# Patient Record
Sex: Female | Born: 1985 | Race: Black or African American | Hispanic: No | Marital: Single | Smoking: Current every day smoker
Health system: Southern US, Community
[De-identification: ages and names within clinical notes are randomized; demographics above are authoritative.]

## PROBLEM LIST (undated history)

## (undated) ENCOUNTER — Emergency Department: Admission: EM | Discharge status: Absent without leave | Payer: Medicaid Other

## (undated) ENCOUNTER — Inpatient Hospital Stay: Payer: Self-pay

## (undated) DIAGNOSIS — E119 Type 2 diabetes mellitus without complications: Secondary | ICD-10-CM

## (undated) DIAGNOSIS — D573 Sickle-cell trait: Secondary | ICD-10-CM

---
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
## 2006-04-16 ENCOUNTER — Emergency Department: Payer: Self-pay | Admitting: Emergency Medicine

## 2006-08-04 ENCOUNTER — Emergency Department: Payer: Self-pay | Admitting: Emergency Medicine

## 2006-09-07 ENCOUNTER — Emergency Department: Payer: Self-pay | Admitting: Emergency Medicine

## 2006-09-09 ENCOUNTER — Emergency Department: Payer: Self-pay | Admitting: Emergency Medicine

## 2008-03-09 ENCOUNTER — Emergency Department: Payer: Self-pay | Admitting: Emergency Medicine

## 2008-06-27 ENCOUNTER — Ambulatory Visit: Payer: Self-pay

## 2008-07-01 ENCOUNTER — Ambulatory Visit: Payer: Self-pay

## 2008-07-19 ENCOUNTER — Encounter: Payer: Self-pay | Admitting: Pediatric Cardiology

## 2008-08-01 ENCOUNTER — Ambulatory Visit: Payer: Self-pay

## 2008-08-16 ENCOUNTER — Inpatient Hospital Stay: Payer: Self-pay | Admitting: Internal Medicine

## 2008-08-25 ENCOUNTER — Encounter: Payer: Self-pay | Admitting: Obstetrics and Gynecology

## 2008-09-10 ENCOUNTER — Inpatient Hospital Stay: Payer: Self-pay | Admitting: Obstetrics and Gynecology

## 2008-09-19 ENCOUNTER — Emergency Department: Payer: Self-pay | Admitting: Emergency Medicine

## 2009-06-11 ENCOUNTER — Emergency Department: Payer: Self-pay | Admitting: Emergency Medicine

## 2009-08-29 ENCOUNTER — Emergency Department: Payer: Self-pay | Admitting: Emergency Medicine

## 2010-04-07 ENCOUNTER — Emergency Department: Payer: Self-pay | Admitting: Emergency Medicine

## 2010-04-08 ENCOUNTER — Emergency Department: Payer: Self-pay | Admitting: Emergency Medicine

## 2010-07-12 ENCOUNTER — Emergency Department: Payer: Self-pay | Admitting: Emergency Medicine

## 2010-07-20 ENCOUNTER — Ambulatory Visit: Payer: Self-pay | Admitting: Obstetrics and Gynecology

## 2010-08-01 ENCOUNTER — Ambulatory Visit: Payer: Self-pay | Admitting: Obstetrics and Gynecology

## 2010-08-22 ENCOUNTER — Observation Stay: Payer: Self-pay | Admitting: Obstetrics and Gynecology

## 2010-09-06 ENCOUNTER — Emergency Department: Payer: Self-pay | Admitting: Emergency Medicine

## 2010-09-23 ENCOUNTER — Inpatient Hospital Stay: Payer: Self-pay | Admitting: Obstetrics and Gynecology

## 2010-10-01 ENCOUNTER — Encounter: Payer: Self-pay | Admitting: Obstetrics and Gynecology

## 2010-10-04 ENCOUNTER — Encounter: Payer: Self-pay | Admitting: Maternal & Fetal Medicine

## 2010-10-08 ENCOUNTER — Encounter: Payer: Self-pay | Admitting: Obstetrics and Gynecology

## 2010-10-11 ENCOUNTER — Encounter: Payer: Self-pay | Admitting: Maternal and Fetal Medicine

## 2010-10-15 ENCOUNTER — Encounter: Payer: Self-pay | Admitting: Obstetrics and Gynecology

## 2010-10-18 ENCOUNTER — Encounter: Payer: Self-pay | Admitting: Maternal and Fetal Medicine

## 2010-10-22 ENCOUNTER — Encounter: Payer: Self-pay | Admitting: Obstetrics and Gynecology

## 2010-10-25 ENCOUNTER — Encounter: Payer: Self-pay | Admitting: Maternal & Fetal Medicine

## 2010-10-29 ENCOUNTER — Encounter: Payer: Self-pay | Admitting: Obstetrics and Gynecology

## 2010-10-30 ENCOUNTER — Encounter: Payer: Self-pay | Admitting: Obstetrics and Gynecology

## 2010-11-01 ENCOUNTER — Encounter: Payer: Self-pay | Admitting: Obstetrics & Gynecology

## 2010-11-05 ENCOUNTER — Encounter: Payer: Self-pay | Admitting: Obstetrics and Gynecology

## 2010-11-08 ENCOUNTER — Encounter: Payer: Self-pay | Admitting: Obstetrics and Gynecology

## 2010-12-05 ENCOUNTER — Observation Stay: Payer: Self-pay | Admitting: Obstetrics and Gynecology

## 2011-01-05 ENCOUNTER — Emergency Department: Payer: Self-pay | Admitting: Emergency Medicine

## 2011-01-06 ENCOUNTER — Emergency Department: Payer: Self-pay | Admitting: Unknown Physician Specialty

## 2011-02-04 ENCOUNTER — Ambulatory Visit: Payer: Self-pay | Admitting: Obstetrics and Gynecology

## 2011-02-13 ENCOUNTER — Emergency Department: Payer: Self-pay | Admitting: Emergency Medicine

## 2011-03-02 ENCOUNTER — Ambulatory Visit: Payer: Self-pay | Admitting: Obstetrics and Gynecology

## 2012-09-24 ENCOUNTER — Emergency Department: Payer: Self-pay | Admitting: Emergency Medicine

## 2013-02-15 ENCOUNTER — Emergency Department: Payer: Self-pay | Admitting: Emergency Medicine

## 2013-02-15 LAB — COMPREHENSIVE METABOLIC PANEL
Albumin: 3.5 g/dL (ref 3.4–5.0)
Alkaline Phosphatase: 91 U/L (ref 50–136)
Anion Gap: 5 — ABNORMAL LOW (ref 7–16)
BUN: 10 mg/dL (ref 7–18)
Chloride: 103 mmol/L (ref 98–107)
Creatinine: 0.75 mg/dL (ref 0.60–1.30)
EGFR (Non-African Amer.): >60
Osmolality: 278 (ref 275–301)
Potassium: 4 mmol/L (ref 3.5–5.1)
SGOT(AST): 17 U/L (ref 15–37)
SGPT (ALT): 19 U/L (ref 12–78)
Total Protein: 7.4 g/dL (ref 6.4–8.2)

## 2013-02-15 LAB — URINALYSIS, COMPLETE
Bacteria: NONE SEEN
Bilirubin,UR: NEGATIVE
Glucose,UR: >=500 mg/dL (ref 0–75)
Ketone: NEGATIVE
Nitrite: NEGATIVE
RBC,UR: 2 /HPF (ref 0–5)
Squamous Epithelial: 11
WBC UR: 17 /HPF (ref 0–5)

## 2013-02-15 LAB — CBC
HCT: 41.1 % (ref 35.0–47.0)
MCHC: 35 g/dL (ref 32.0–36.0)
Platelet: 231 10*3/uL (ref 150–440)
RBC: 4.81 10*6/uL (ref 3.80–5.20)
RDW: 13.2 % (ref 11.5–14.5)
WBC: 11 10*3/uL (ref 3.6–11.0)

## 2013-02-16 ENCOUNTER — Emergency Department: Payer: Self-pay | Admitting: Emergency Medicine

## 2013-02-16 LAB — URINALYSIS, COMPLETE
Bilirubin,UR: NEGATIVE
Glucose,UR: >=500 mg/dL (ref 0–75)
Nitrite: NEGATIVE
Ph: 6 (ref 4.5–8.0)
Specific Gravity: 1.016 (ref 1.003–1.030)
Squamous Epithelial: 3
WBC UR: 3 /HPF (ref 0–5)

## 2013-02-16 LAB — CBC WITH DIFFERENTIAL/PLATELET
Basophil #: 0.1 10*3/uL (ref 0.0–0.1)
Basophil %: 0.6 %
Eosinophil #: 0.3 10*3/uL (ref 0.0–0.7)
Eosinophil %: 2.1 %
HCT: 42.8 % (ref 35.0–47.0)
HGB: 14.9 g/dL (ref 12.0–16.0)
Lymphocyte #: 3.3 10*3/uL (ref 1.0–3.6)
Lymphocyte %: 25.6 %
MCH: 29.8 pg (ref 26.0–34.0)
MCHC: 34.8 g/dL (ref 32.0–36.0)
MCV: 86 fL (ref 80–100)
Monocyte %: 6.9 %
Neutrophil #: 8.3 10*3/uL — ABNORMAL HIGH (ref 1.4–6.5)
Neutrophil %: 64.8 %
Platelet: 242 10*3/uL (ref 150–440)
RBC: 5 10*6/uL (ref 3.80–5.20)
RDW: 13.3 % (ref 11.5–14.5)
WBC: 12.8 10*3/uL — ABNORMAL HIGH (ref 3.6–11.0)

## 2013-02-16 LAB — BASIC METABOLIC PANEL
Anion Gap: 7 (ref 7–16)
Calcium, Total: 9.2 mg/dL (ref 8.5–10.1)
Chloride: 103 mmol/L (ref 98–107)
Creatinine: 0.74 mg/dL (ref 0.60–1.30)
EGFR (Non-African Amer.): >60
Potassium: 4 mmol/L (ref 3.5–5.1)
Sodium: 133 mmol/L — ABNORMAL LOW (ref 136–145)

## 2013-02-17 LAB — URINE CULTURE

## 2013-11-03 ENCOUNTER — Inpatient Hospital Stay: Payer: Self-pay | Admitting: Specialist

## 2013-11-03 LAB — URINALYSIS, COMPLETE
BILIRUBIN, UR: NEGATIVE
Bacteria: NONE SEEN
Hyaline Cast: 4
NITRITE: NEGATIVE
PH: 5 (ref 4.5–8.0)
Protein: 100
RBC,UR: 11 /HPF (ref 0–5)
SPECIFIC GRAVITY: 1.009 (ref 1.003–1.030)
Squamous Epithelial: 4
WBC UR: 56 /HPF (ref 0–5)

## 2013-11-03 LAB — CBC
HCT: 42.1 % (ref 35.0–47.0)
HGB: 13.6 g/dL (ref 12.0–16.0)
MCH: 28.5 pg (ref 26.0–34.0)
MCHC: 32.4 g/dL (ref 32.0–36.0)
MCV: 88 fL (ref 80–100)
Platelet: 188 10*3/uL (ref 150–440)
RBC: 4.79 10*6/uL (ref 3.80–5.20)
RDW: 13.3 % (ref 11.5–14.5)
WBC: 26.6 10*3/uL — ABNORMAL HIGH (ref 3.6–11.0)

## 2013-11-03 LAB — COMPREHENSIVE METABOLIC PANEL
ALBUMIN: 3.1 g/dL — AB (ref 3.4–5.0)
ALK PHOS: 116 U/L
ALT: 14 U/L (ref 12–78)
AST: 19 U/L (ref 15–37)
Anion Gap: 9 (ref 7–16)
BUN: 8 mg/dL (ref 7–18)
Bilirubin,Total: 0.8 mg/dL (ref 0.2–1.0)
CHLORIDE: 102 mmol/L (ref 98–107)
CREATININE: 1.44 mg/dL — AB (ref 0.60–1.30)
Calcium, Total: 9 mg/dL (ref 8.5–10.1)
Co2: 19 mmol/L — ABNORMAL LOW (ref 21–32)
EGFR (African American): 58 — ABNORMAL LOW
GFR CALC NON AF AMER: 50 — AB
Glucose: 261 mg/dL — ABNORMAL HIGH (ref 65–99)
OSMOLALITY: 268 (ref 275–301)
Potassium: 3.6 mmol/L (ref 3.5–5.1)
Sodium: 130 mmol/L — ABNORMAL LOW (ref 136–145)
Total Protein: 7.7 g/dL (ref 6.4–8.2)

## 2013-11-03 LAB — PREGNANCY, URINE: Pregnancy Test, Urine: NEGATIVE m[IU]/mL

## 2013-11-03 LAB — LIPASE, BLOOD: LIPASE: 62 U/L — AB (ref 73–393)

## 2013-11-04 LAB — URINE CULTURE

## 2013-11-04 LAB — CBC WITH DIFFERENTIAL/PLATELET
BASOS ABS: 0 10*3/uL (ref 0.0–0.1)
BASOS PCT: 0.1 %
EOS ABS: 0 10*3/uL (ref 0.0–0.7)
Eosinophil %: 0 %
HCT: 33.6 % — ABNORMAL LOW (ref 35.0–47.0)
HGB: 11.1 g/dL — AB (ref 12.0–16.0)
LYMPHS ABS: 0.2 10*3/uL — AB (ref 1.0–3.6)
Lymphocyte %: 1 %
MCH: 28.7 pg (ref 26.0–34.0)
MCHC: 33 g/dL (ref 32.0–36.0)
MCV: 87 fL (ref 80–100)
Monocyte #: 0.8 x10 3/mm (ref 0.2–0.9)
Monocyte %: 4.5 %
NEUTROS ABS: 17.1 10*3/uL — AB (ref 1.4–6.5)
Neutrophil %: 94.4 %
PLATELETS: 142 10*3/uL — AB (ref 150–440)
RBC: 3.86 10*6/uL (ref 3.80–5.20)
RDW: 13 % (ref 11.5–14.5)
WBC: 18.1 10*3/uL — ABNORMAL HIGH (ref 3.6–11.0)

## 2013-11-04 LAB — BASIC METABOLIC PANEL
Anion Gap: 10 (ref 7–16)
BUN: 8 mg/dL (ref 7–18)
Calcium, Total: 7.1 mg/dL — ABNORMAL LOW (ref 8.5–10.1)
Chloride: 110 mmol/L — ABNORMAL HIGH (ref 98–107)
Co2: 15 mmol/L — ABNORMAL LOW (ref 21–32)
Creatinine: 1.31 mg/dL — ABNORMAL HIGH (ref 0.60–1.30)
EGFR (African American): >60
GFR CALC NON AF AMER: 56 — AB
Glucose: 223 mg/dL — ABNORMAL HIGH (ref 65–99)
Osmolality: 275 (ref 275–301)
Potassium: 3.7 mmol/L (ref 3.5–5.1)
Sodium: 135 mmol/L — ABNORMAL LOW (ref 136–145)

## 2013-11-04 LAB — HEMOGLOBIN A1C: HEMOGLOBIN A1C: 11.1 % — AB (ref 4.2–6.3)

## 2013-11-04 LAB — TSH: THYROID STIMULATING HORM: 0.638 u[IU]/mL

## 2013-11-07 LAB — BASIC METABOLIC PANEL
ANION GAP: 8 (ref 7–16)
BUN: 4 mg/dL — ABNORMAL LOW (ref 7–18)
Calcium, Total: 7.7 mg/dL — ABNORMAL LOW (ref 8.5–10.1)
Chloride: 112 mmol/L — ABNORMAL HIGH (ref 98–107)
Co2: 19 mmol/L — ABNORMAL LOW (ref 21–32)
Creatinine: 0.94 mg/dL (ref 0.60–1.30)
EGFR (African American): >60
Glucose: 107 mg/dL — ABNORMAL HIGH (ref 65–99)
OSMOLALITY: 275 (ref 275–301)
POTASSIUM: 3 mmol/L — AB (ref 3.5–5.1)
SODIUM: 139 mmol/L (ref 136–145)

## 2013-11-07 LAB — CBC WITH DIFFERENTIAL/PLATELET
Basophil #: 0.1 10*3/uL (ref 0.0–0.1)
Basophil %: 0.5 %
EOS ABS: 0.1 10*3/uL (ref 0.0–0.7)
Eosinophil %: 0.9 %
HCT: 29.8 % — ABNORMAL LOW (ref 35.0–47.0)
HGB: 10.2 g/dL — AB (ref 12.0–16.0)
Lymphocyte #: 1.8 10*3/uL (ref 1.0–3.6)
Lymphocyte %: 14.5 %
MCH: 29.3 pg (ref 26.0–34.0)
MCHC: 34.2 g/dL (ref 32.0–36.0)
MCV: 86 fL (ref 80–100)
Monocyte #: 1.8 x10 3/mm — ABNORMAL HIGH (ref 0.2–0.9)
Monocyte %: 14 %
Neutrophil #: 8.9 10*3/uL — ABNORMAL HIGH (ref 1.4–6.5)
Neutrophil %: 70.1 %
Platelet: 165 10*3/uL (ref 150–440)
RBC: 3.48 10*6/uL — ABNORMAL LOW (ref 3.80–5.20)
RDW: 13.8 % (ref 11.5–14.5)
WBC: 12.8 10*3/uL — AB (ref 3.6–11.0)

## 2013-11-08 LAB — BASIC METABOLIC PANEL
ANION GAP: 10 (ref 7–16)
BUN: 4 mg/dL — ABNORMAL LOW (ref 7–18)
CO2: 22 mmol/L (ref 21–32)
Calcium, Total: 8 mg/dL — ABNORMAL LOW (ref 8.5–10.1)
Chloride: 111 mmol/L — ABNORMAL HIGH (ref 98–107)
Creatinine: 0.98 mg/dL (ref 0.60–1.30)
EGFR (African American): >60
EGFR (Non-African Amer.): >60
GLUCOSE: 92 mg/dL (ref 65–99)
OSMOLALITY: 282 (ref 275–301)
POTASSIUM: 3.5 mmol/L (ref 3.5–5.1)
SODIUM: 143 mmol/L (ref 136–145)

## 2013-11-08 LAB — CBC WITH DIFFERENTIAL/PLATELET
BANDS NEUTROPHIL: 3 %
COMMENT - H1-COM1: NORMAL
HCT: 31.7 % — ABNORMAL LOW (ref 35.0–47.0)
HGB: 10.8 g/dL — ABNORMAL LOW (ref 12.0–16.0)
Lymphocytes: 22 %
MCH: 29.3 pg (ref 26.0–34.0)
MCHC: 34.1 g/dL (ref 32.0–36.0)
MCV: 86 fL (ref 80–100)
MONOS PCT: 17 %
Metamyelocyte: 2 %
Myelocyte: 3 %
Platelet: 181 10*3/uL (ref 150–440)
RBC: 3.69 10*6/uL — ABNORMAL LOW (ref 3.80–5.20)
RDW: 14.2 % (ref 11.5–14.5)
Segmented Neutrophils: 52 %
Variant Lymphocyte - H1-Rlymph: 1 %
WBC: 12 10*3/uL — AB (ref 3.6–11.0)

## 2013-11-08 LAB — URINE CULTURE

## 2013-11-10 LAB — CULTURE, BLOOD (SINGLE)

## 2014-01-24 ENCOUNTER — Emergency Department: Payer: Self-pay | Admitting: Emergency Medicine

## 2014-01-24 LAB — URINALYSIS, COMPLETE
Bilirubin,UR: NEGATIVE
GLUCOSE, UR: NEGATIVE mg/dL (ref 0–75)
NITRITE: NEGATIVE
PH: 5 (ref 4.5–8.0)
SPECIFIC GRAVITY: 1.012 (ref 1.003–1.030)
Squamous Epithelial: 20

## 2014-01-24 LAB — COMPREHENSIVE METABOLIC PANEL
AST: 16 U/L (ref 15–37)
Albumin: 3.1 g/dL — ABNORMAL LOW (ref 3.4–5.0)
Alkaline Phosphatase: 82 U/L
Anion Gap: 9 (ref 7–16)
BUN: 5 mg/dL — AB (ref 7–18)
Bilirubin,Total: 0.8 mg/dL (ref 0.2–1.0)
CALCIUM: 8.4 mg/dL — AB (ref 8.5–10.1)
CO2: 20 mmol/L — AB (ref 21–32)
Chloride: 104 mmol/L (ref 98–107)
Creatinine: 0.91 mg/dL (ref 0.60–1.30)
EGFR (African American): >60
EGFR (Non-African Amer.): >60
Glucose: 144 mg/dL — ABNORMAL HIGH (ref 65–99)
OSMOLALITY: 266 (ref 275–301)
POTASSIUM: 3 mmol/L — AB (ref 3.5–5.1)
SGPT (ALT): 15 U/L
Sodium: 133 mmol/L — ABNORMAL LOW (ref 136–145)
Total Protein: 8.3 g/dL — ABNORMAL HIGH (ref 6.4–8.2)

## 2014-01-24 LAB — CBC
HCT: 39.6 % (ref 35.0–47.0)
HGB: 13 g/dL (ref 12.0–16.0)
MCH: 28.7 pg (ref 26.0–34.0)
MCHC: 32.9 g/dL (ref 32.0–36.0)
MCV: 87 fL (ref 80–100)
Platelet: 267 10*3/uL (ref 150–440)
RBC: 4.53 10*6/uL (ref 3.80–5.20)
RDW: 14.4 % (ref 11.5–14.5)
WBC: 22.5 10*3/uL — AB (ref 3.6–11.0)

## 2014-01-24 LAB — LIPASE, BLOOD: Lipase: 95 U/L (ref 73–393)

## 2014-01-26 LAB — URINE CULTURE

## 2014-09-01 DIAGNOSIS — Z8751 Personal history of pre-term labor: Secondary | ICD-10-CM | POA: Insufficient documentation

## 2014-09-02 DIAGNOSIS — A5901 Trichomonal vulvovaginitis: Secondary | ICD-10-CM | POA: Insufficient documentation

## 2014-09-02 LAB — OB RESULTS CONSOLE RPR: RPR: NONREACTIVE

## 2014-09-02 LAB — OB RESULTS CONSOLE HGB/HCT, BLOOD
HEMATOCRIT: 34 %
HEMOGLOBIN: 13.5 g/dL

## 2014-09-02 LAB — OB RESULTS CONSOLE PLATELET COUNT: PLATELETS: 239 10*3/uL

## 2014-09-02 LAB — OB RESULTS CONSOLE VARICELLA ZOSTER ANTIBODY, IGG: Varicella: IMMUNE

## 2014-09-02 LAB — OB RESULTS CONSOLE RUBELLA ANTIBODY, IGM: Rubella: IMMUNE

## 2014-09-02 LAB — OB RESULTS CONSOLE HIV ANTIBODY (ROUTINE TESTING): HIV: NONREACTIVE

## 2014-09-02 LAB — OB RESULTS CONSOLE HEPATITIS B SURFACE ANTIGEN: HEP B S AG: NEGATIVE

## 2014-09-19 ENCOUNTER — Ambulatory Visit
Admit: 2014-09-19 | Disposition: A | Discharge status: Home / Self Care | Payer: Self-pay | Attending: Obstetrics and Gynecology | Admitting: Obstetrics and Gynecology

## 2014-09-30 ENCOUNTER — Ambulatory Visit
Admit: 2014-09-30 | Disposition: A | Discharge status: Home / Self Care | Payer: Self-pay | Attending: Obstetrics and Gynecology | Admitting: Obstetrics and Gynecology

## 2014-10-11 ENCOUNTER — Encounter
Admit: 2014-10-11 | Disposition: A | Discharge status: Home / Self Care | Payer: Self-pay | Attending: Pediatric Cardiology | Admitting: Pediatric Cardiology

## 2014-10-22 NOTE — H&P (Signed)
PATIENT NAME:  Victoria Cortez, Victoria Cortez DATE OF BIRTH:  21-Nov-1985  DATE OF ADMISSION:  11/03/2013  PRIMARY CARE PROVIDER: None.  EMERGENCY DEPARTMENT REFERRING PHYSICIAN: Dr. Mayford KnifeWilliams.   CHIEF COMPLAINT: Left lower quadrant abdominal pain, fever.   HISTORY OF PRESENT ILLNESS: The patient is a 29 year old African American female with history of diabetes, which started as gestational diabetes and has had diabetes since 3 years, is not on any treatment due to unable to get any medications and be seen by a physician according to her, who presents with a sharp left lower quadrant abdominal pain ongoing since yesterday. She also has had nausea and had high fevers. Has not had any emesis. The patient also has been having some shortness of breath and coughing. She came to the ED and was noted to have an elevated WBC count at 26,000, and her urinalysis was positive for a urinary tract infection, with CT of the abdomen suggestive of acute pyelonephritis. The patient does complain of pressure with urination, but no burning or hesitancy. She denies any chest pain or palpitations. Denies any diarrhea.   PAST MEDICAL HISTORY: 1.  History for diabetes.  2.  Sickle cell trait.   PAST SURGICAL HISTORY: Status post C-section.   ALLERGIES: None.   MEDICATIONS: None.   SOCIAL HISTORY: Smokes 1 pack per day for multiple years. Drinks socially. No drug use.   FAMILY HISTORY: Father with diabetes.  REVIEW OF SYSTEMS:    CONSTITUTIONAL: Complains of fever, fatigue, weakness, left lower quadrant sharp abdominal pain. No weight loss. No weight gains.  EYES: No blurred or double vision. No pain. No redness. No inflammation.  EARS, NOSE, THROAT: No tinnitus. No ear pain. No hearing loss. No seasonal or year-round allergies. No epistaxis. No difficulty swallowing.  RESPIRATORY: Complains of cough but no wheezing, no hemoptysis. Some shortness of breath.  CARDIOVASCULAR: Denies any chest pain, orthopnea,  edema or arrhythmia.  GASTROINTESTINAL: Complains of some nausea but no vomiting, diarrhea. Complains of left lower quadrant sharp abdominal pain. No hematemesis. No melena. No IBS. No jaundice.  GENITOURINARY: Denies any dysuria, hematuria. Complains of pressure with urination.  ENDOCRINE: Complains of polyhidria, nocturia.  HEMATOLOGIC AND LYMPHATIC: Denies anemia, bruisability or bleeding.  SKIN: No acne or rash. No changes in mole, hair or skin.  MUSCULOSKELETAL: Denies any pain in neck, back or shoulder.  NEUROLOGIC: No numbness, CVA, TIA or seizures.  PSYCHIATRIC: No anxiety, insomnia or ADD.   PHYSICAL EXAMINATION: VITAL SIGNS: Temperature 98.5, pulse 126, respirations 18, blood pressure 128/68, O2 sat 99%.  GENERAL: The patient is a well-developed, well-nourished female in no acute distress.  HEENT: Head atraumatic, normocephalic. Pupils equally round, reactive to light and accommodation. There is no conjunctival pallor. No scleral icterus. Nasal exam shows no drainage or ulceration. Oropharynx is very dry. No exudate.  NECK: Supple without any JVD.  CARDIOVASCULAR: Tachycardic. No murmurs, rubs, clicks or gallops.  LUNGS: Clear to auscultation bilaterally without any rales, rhonchi, wheezing.  ABDOMEN: Soft, nontender, nondistended. Positive bowel sounds x 4.  EXTREMITIES: No clubbing, cyanosis or edema.  SKIN: No rash.  LYMPHATICS: No lymph nodes palpable.  VASCULAR: Good DP, PT pulses.  PSYCHIATRIC: Not anxious or depressed.  NEUROLOGIC: Awake, alert, oriented x 3. No focal deficits.   LABORATORY AND RADIOLOGICAL DATA: Glucose 261, BUN 8, creatinine 1.44, sodium 130, potassium 3.6, chloride 102. CO2 is 19, calcium 9.0, lipase 62. LFTs: Total protein 7.7, albumin 3.1, bilirubin total 0.8; alkaline phosphatase is 116, AST  19, ALT 14. WBC 26.6, hemoglobin 13.6, platelet count 188. Urinalysis: 2+ leukocytes, WBCs 56.   CT scan of the abdomen and pelvis shows mildly enlarged and  acutely inflamed left kidney and ureter. No obstruction. Distended bladder with perhaps mild bladder wall thickening. Normal right kidney and ureter. Mild bronchiectasis and increased marking in the left lower lobe medial  basal segment.   ASSESSMENT AND PLAN: The patient is a 29 year old African American female who presents with left lower quadrant abdominal pain, cough, fevers,  1.  Left lower quadrant abdominal pain due to acute pyelonephritis: Will treat her with IV ceftriaxone. Follow urine cultures.  2.  Possible bronchitis: She will be on ceftriaxone for the urinary tract infection, and I will add azithromycin for atypical coverage.  3.  Diabetes: The patient is not on any treatment. I will check a hemoglobin A1c. Place her on sliding scale. Start her on glipizide. If her renal function normalizes, she can be on metformin. The patient may end up needing insulin.  4.  Acute renal failure: Will give her IV fluids. Follow renal function.  5.  Nicotine addiction: The patient was given smoking cessation; 4 minutes spent. The patient was recommended to stop smoking.  6.  She will be on Lovenox for deep vein thrombosis prophylaxis.   TIME SPENT: 50 minutes.    ____________________________ Lacie Scotts Allena Katz, MD shp:jcm D: 11/03/2013 13:53:35 ET T: 11/03/2013 14:23:18 ET JOB#: [id number]  cc: Analisia Kingsford H. Allena Katz, MD, <Dictator> Charise Carwin MD ELECTRONICALLY SIGNED 11/03/2013 16:18

## 2014-10-22 NOTE — Discharge Summary (Signed)
PATIENT NAME:  Victoria Cortez, Victoria Cortez MR#:  [id number] DATE OF BIRTH:  24-Jul-1985  For a detailed note, please take a look at the history and physical done on admission by Dr. Auburn Bilberry.   DIAGNOSES AT DISCHARGE: As follows:  1. Acute pyelonephritis.  2. Pneumonia.  3. Systemic inflammatory response syndrome secondary to acute pyelonephritis and pneumonia.  4. Leukocytosis secondary to the infection as mentioned as above.  5. Diabetes, uncontrolled.  6. Hypokalemia.   DIET: The patient is being discharged on American Diabetic Association diet.   ACTIVITY: As tolerated.   FOLLOWUP:  Follow up at the [facility] in next 1-2 weeks.   DISCHARGE MEDICATIONS: 1. Metformin 500 mg PO BID.   2. Glipizide 10 mg daily. 3. Augmentin5 mg b.i.d. x 8 days. 4. Ciprofloxacin 5 mg b.i.d. x 8 days.    PERTINENT STUDIES DONE DURING THE HOSPITAL COURSE: Are as follows: A CT scan of the abdomen and pelvis done with contrast showing mildly enlarged and acutely inflamed left kidney and ureter.  No obstructing calculus. Consider pyelonephritis or ascending urinary tract infection, distended bladder. Diverticulosis, mild bronchiectasis, and increased markings on left lower lobe. A chest x-ray done on 11/05/2013 showing a left lower lobe infiltrate or atelectasis. Pneumonia is suspected.   HOSPITAL COURSE: This is a 29 year old female with medical problems as mentioned above, presented to the hospital on 11/03/2013 due to abdominal pain which was left flank pain along with abnormal urinalysis.  1. Acute pyelonephritis.  This was likely the cause of patient's left flank pain, nausea, and vomiting. The patient was admitted to the hospital, started on IV ceftriaxone initially. Eventually switched over to Levaquin and Zosyn. After getting aggressive IV antibiotic therapy and supportive care with IV fluids and pain control, her clinical symptoms have improved. She has left flank pain. She has no further nausea or  vomiting. She does continue to have fevers, but that is expected with acute pyelonephritis.  Her urine cultures although have been negative so have been her blood cultures. At present, I am discharging her on oral Augmentin and ciprofloxacin as stated.  2. Pneumonia. This was noticed on the chest x-ray on May 8th. She has been adequately treated with IV Levaquin in the hospital. She is not hypoxic. She is not having any chest pain. Her sputum cultures and blood cultures have been negative. At present, I am discharging her on oral Augmentin as mentioned. 3. Diabetes. The patient was noncompliant as she was not taking any medications prior to coming to hospital.  Her hemoglobin A1c was noted to be as high as 11. While in the hospital, the patient was treated with glipizide along with sliding scale insulin. At present, I am discharging her on oral metformin and glipizide. She was seen by diabetic lifestyle services in the hospital. She was given a glucometer, test strips, and lancets, and she will follow up with her primary care physician at the [facility] for further management of her diabetes.  4. Acute renal failure. This was likely acute tubular necrosis secondary to acute pyelonephritis and dehydration. It has since then improved and resolved with IV fluids.  5. Tobacco abuse. The patient was strongly advised to quit smoking. Was placed on a nicotine patch while in the hospital.   CODE STATUS: The patient is a full code.   DISPOSITION: She is being discharged home.  TIME SPENT ON DISCHARGE: 40 minutes.   ____________________________ Rolly Pancake. Cherlynn Kaiser, MD vjs:dd D: 11/08/2013 15:44:46 ET  T: 11/09/2013 02:12:49 ET JOB#: [id number]  cc: Rolly PancakeVivek J. Cherlynn KaiserSainani, MD, <Dictator> [facility]  Houston SirenVIVEK J Geral Coker MD ELECTRONICALLY SIGNED 11/10/2013 14:05

## 2014-11-14 ENCOUNTER — Ambulatory Visit: Payer: Self-pay | Admitting: Dietician

## 2014-11-21 ENCOUNTER — Encounter: Payer: Self-pay | Admitting: Obstetrics and Gynecology

## 2014-11-21 ENCOUNTER — Encounter: Payer: Self-pay | Admitting: Dietician

## 2014-11-21 ENCOUNTER — Ambulatory Visit: Payer: Self-pay | Admitting: Dietician

## 2014-11-21 ENCOUNTER — Telehealth: Payer: Self-pay | Admitting: Dietician

## 2014-11-21 NOTE — Telephone Encounter (Signed)
Pt did not come to appt today. Called pt-pt. Reports doing well and BG's good with recent FBG's 88-97 and 2 hr pp BG's 105-150. Currently taking N 38 units and R 22 units every AM + N 18 units + R 18 units before supper. To FU with Dr. Feliberto GottronSchermerhorn on 11-23-14. Pt is 27.[redacted] wks gestation. To be scheduled for c-section at 37 wks. Pt does not want to reschedule appt at this time. Advised pt referral is in effect x1 yr.  and to call  if wishes to schedule further FU.

## 2014-11-24 LAB — OB RESULTS CONSOLE GC/CHLAMYDIA
Chlamydia: NEGATIVE
GC PROBE AMP, GENITAL: NEGATIVE

## 2014-11-24 LAB — OB RESULTS CONSOLE ABO/RH

## 2014-11-24 LAB — OB RESULTS CONSOLE HIV ANTIBODY (ROUTINE TESTING): HIV: NONREACTIVE

## 2014-11-24 LAB — OB RESULTS CONSOLE RPR: RPR: NONREACTIVE

## 2014-11-26 ENCOUNTER — Emergency Department
Admission: EM | Admit: 2014-11-26 | Discharge: 2014-11-26 | Disposition: A | Discharge status: Home / Self Care | Payer: Medicaid Other | Attending: Student | Admitting: Student

## 2014-11-26 ENCOUNTER — Encounter: Payer: Self-pay | Admitting: Emergency Medicine

## 2014-11-26 ENCOUNTER — Observation Stay
Admission: RE | Admit: 2014-11-26 | Discharge: 2014-11-26 | Disposition: A | Discharge status: Home / Self Care | Payer: Medicaid Other | Source: Home / Self Care | Attending: Obstetrics and Gynecology | Admitting: Obstetrics and Gynecology

## 2014-11-26 DIAGNOSIS — Z3A28 28 weeks gestation of pregnancy: Secondary | ICD-10-CM | POA: Insufficient documentation

## 2014-11-26 DIAGNOSIS — O99332 Smoking (tobacco) complicating pregnancy, second trimester: Secondary | ICD-10-CM | POA: Diagnosis not present

## 2014-11-26 DIAGNOSIS — F17210 Nicotine dependence, cigarettes, uncomplicated: Secondary | ICD-10-CM | POA: Insufficient documentation

## 2014-11-26 DIAGNOSIS — Y998 Other external cause status: Secondary | ICD-10-CM | POA: Insufficient documentation

## 2014-11-26 DIAGNOSIS — S199XXA Unspecified injury of neck, initial encounter: Secondary | ICD-10-CM | POA: Insufficient documentation

## 2014-11-26 DIAGNOSIS — E119 Type 2 diabetes mellitus without complications: Secondary | ICD-10-CM | POA: Diagnosis not present

## 2014-11-26 DIAGNOSIS — S3991XA Unspecified injury of abdomen, initial encounter: Secondary | ICD-10-CM | POA: Diagnosis not present

## 2014-11-26 DIAGNOSIS — Y9289 Other specified places as the place of occurrence of the external cause: Secondary | ICD-10-CM | POA: Diagnosis not present

## 2014-11-26 DIAGNOSIS — Z3490 Encounter for supervision of normal pregnancy, unspecified, unspecified trimester: Secondary | ICD-10-CM

## 2014-11-26 DIAGNOSIS — R000 Tachycardia, unspecified: Secondary | ICD-10-CM | POA: Diagnosis not present

## 2014-11-26 DIAGNOSIS — O99282 Endocrine, nutritional and metabolic diseases complicating pregnancy, second trimester: Secondary | ICD-10-CM | POA: Diagnosis not present

## 2014-11-26 DIAGNOSIS — O9989 Other specified diseases and conditions complicating pregnancy, childbirth and the puerperium: Secondary | ICD-10-CM | POA: Diagnosis not present

## 2014-11-26 DIAGNOSIS — Y9389 Activity, other specified: Secondary | ICD-10-CM | POA: Diagnosis not present

## 2014-11-26 DIAGNOSIS — O9A212 Injury, poisoning and certain other consequences of external causes complicating pregnancy, second trimester: Secondary | ICD-10-CM | POA: Insufficient documentation

## 2014-11-26 HISTORY — DX: Sickle-cell trait: D57.3

## 2014-11-26 HISTORY — DX: Type 2 diabetes mellitus without complications: E11.9

## 2014-11-26 LAB — URINALYSIS COMPLETE WITH MICROSCOPIC (ARMC ONLY)
Bilirubin Urine: NEGATIVE
HGB URINE DIPSTICK: NEGATIVE
KETONES UR: NEGATIVE mg/dL
Nitrite: NEGATIVE
PROTEIN: 100 mg/dL — AB
SPECIFIC GRAVITY, URINE: 1.014 (ref 1.005–1.030)
pH: 6 (ref 5.0–8.0)

## 2014-11-26 LAB — COMPREHENSIVE METABOLIC PANEL
ALK PHOS: 66 U/L (ref 38–126)
ALT: 11 U/L — ABNORMAL LOW (ref 14–54)
ANION GAP: 8 (ref 5–15)
AST: 15 U/L (ref 15–41)
Albumin: 3 g/dL — ABNORMAL LOW (ref 3.5–5.0)
BUN: 6 mg/dL (ref 6–20)
CO2: 19 mmol/L — AB (ref 22–32)
CREATININE: 0.72 mg/dL (ref 0.44–1.00)
Calcium: 8.7 mg/dL — ABNORMAL LOW (ref 8.9–10.3)
Chloride: 106 mmol/L (ref 101–111)
GFR calc non Af Amer: >60 mL/min (ref >60)
Glucose, Bld: 223 mg/dL — ABNORMAL HIGH (ref 65–99)
Potassium: 3.7 mmol/L (ref 3.5–5.1)
SODIUM: 133 mmol/L — AB (ref 135–145)
Total Bilirubin: 0.5 mg/dL (ref 0.3–1.2)
Total Protein: 7 g/dL (ref 6.5–8.1)

## 2014-11-26 LAB — GLUCOSE, CAPILLARY: GLUCOSE-CAPILLARY: 149 mg/dL — AB (ref 65–99)

## 2014-11-26 LAB — CBC WITH DIFFERENTIAL/PLATELET
BASOS ABS: 0.1 10*3/uL (ref 0–0.1)
BASOS PCT: 1 %
EOS ABS: 0.6 10*3/uL (ref 0–0.7)
Eosinophils Relative: 4 %
HCT: 34.1 % — ABNORMAL LOW (ref 35.0–47.0)
Hemoglobin: 11.4 g/dL — ABNORMAL LOW (ref 12.0–16.0)
Lymphocytes Relative: 11 %
Lymphs Abs: 1.7 10*3/uL (ref 1.0–3.6)
MCH: 29.6 pg (ref 26.0–34.0)
MCHC: 33.6 g/dL (ref 32.0–36.0)
MCV: 88.3 fL (ref 80.0–100.0)
Monocytes Absolute: 0.7 10*3/uL (ref 0.2–0.9)
Monocytes Relative: 5 %
Neutro Abs: 12.3 10*3/uL — ABNORMAL HIGH (ref 1.4–6.5)
Neutrophils Relative %: 79 %
Platelets: 207 10*3/uL (ref 150–440)
RBC: 3.86 MIL/uL (ref 3.80–5.20)
RDW: 13.1 % (ref 11.5–14.5)
WBC: 15.4 10*3/uL — AB (ref 3.6–11.0)

## 2014-11-26 MED ORDER — INSULIN NPH (HUMAN) (ISOPHANE) 100 UNIT/ML ~~LOC~~ SUSP: 18.0000 [IU] | Freq: Every day | SUBCUTANEOUS | Status: DC

## 2014-11-26 MED ORDER — TERBUTALINE SULFATE 1 MG/ML IJ SOLN
INTRAMUSCULAR | Status: AC
  Administered 2014-11-26: 0.25 mg via SUBCUTANEOUS
  Filled 2014-11-26: qty 1

## 2014-11-26 MED ORDER — SODIUM CHLORIDE 0.9 % IV BOLUS (SEPSIS)
1000.0000 mL | Freq: Once | INTRAVENOUS | Status: AC
  Administered 2014-11-26: 1000 mL via INTRAVENOUS

## 2014-11-26 MED ORDER — INSULIN ASPART 100 UNIT/ML ~~LOC~~ SOLN
16.0000 [IU] | Freq: Every day | SUBCUTANEOUS | Status: DC
  Administered 2014-11-26: 16 [IU] via SUBCUTANEOUS
  Filled 2014-11-26 (×5): qty 0.16
  Filled 2014-11-26: qty 16

## 2014-11-26 MED ORDER — TERBUTALINE SULFATE 1 MG/ML IJ SOLN
0.2500 mg | Freq: Once | INTRAMUSCULAR | Status: AC
  Administered 2014-11-26: 0.25 mg via SUBCUTANEOUS

## 2014-11-26 MED ORDER — INSULIN REGULAR HUMAN 100 UNIT/ML IJ SOLN: 16.0000 [IU] | Freq: Every day | INTRAMUSCULAR | Status: DC

## 2014-11-26 MED ORDER — INSULIN DETEMIR 100 UNIT/ML ~~LOC~~ SOLN
18.0000 [IU] | Freq: Every day | SUBCUTANEOUS | Status: DC
  Administered 2014-11-26: 18 [IU] via SUBCUTANEOUS
  Filled 2014-11-26: qty 0.18
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
## 2014-11-26 NOTE — ED Notes (Addendum)
Report called to Sylvan Cheeseina Wilcox on L&D. Instructed to leave IV access in place.
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
## 2014-11-26 NOTE — ED Notes (Signed)
D/c instructions reviewed w/ pt - pt denies any further questions or concerns at present.  Patient taken to L&D for further evaluation

## 2014-11-26 NOTE — Discharge Summary (Signed)
Pt discharged home in stable condition, ambulatory. Discharge instructions reviewed with pt, pt verbalized understanding.  Pt waiting in room for ride home from FOB.
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
## 2014-12-06 LAB — OB RESULTS CONSOLE RPR: RPR: NONREACTIVE

## 2014-12-06 LAB — OB RESULTS CONSOLE ABO/RH: RH Type: POSITIVE

## 2014-12-06 LAB — OB RESULTS CONSOLE ANTIBODY SCREEN: Antibody Screen: NEGATIVE

## 2015-01-09 ENCOUNTER — Encounter: Payer: Self-pay | Admitting: *Deleted

## 2015-01-09 ENCOUNTER — Observation Stay
Admission: EM | Admit: 2015-01-09 | Discharge: 2015-01-09 | Disposition: A | Discharge status: Home / Self Care | Payer: Medicaid Other | Attending: Obstetrics and Gynecology | Admitting: Obstetrics and Gynecology

## 2015-01-09 DIAGNOSIS — A599 Trichomoniasis, unspecified: Secondary | ICD-10-CM | POA: Insufficient documentation

## 2015-01-09 DIAGNOSIS — Z3A34 34 weeks gestation of pregnancy: Secondary | ICD-10-CM | POA: Diagnosis not present

## 2015-01-09 DIAGNOSIS — R109 Unspecified abdominal pain: Secondary | ICD-10-CM | POA: Insufficient documentation

## 2015-01-09 DIAGNOSIS — O26893 Other specified pregnancy related conditions, third trimester: Principal | ICD-10-CM | POA: Insufficient documentation

## 2015-01-09 LAB — WET PREP, GENITAL: YEAST WET PREP: NONE SEEN

## 2015-01-09 MED ORDER — AZITHROMYCIN 250 MG PO TABS
1000.0000 mg | ORAL_TABLET | Freq: Once | ORAL | Status: AC
  Administered 2015-01-09: 1000 mg via ORAL
  Filled 2015-01-09: qty 2

## 2015-01-09 NOTE — OB Triage Note (Signed)
Began feeling uc's this morning.  Has not experienced bleeding or leakage of fluid.  Reports cesarean section x 2 previously.  Reports having type II diabetes and uses Insulin bid.  Verbalizes no other c/o.
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
## 2015-01-09 NOTE — Discharge Instructions (Signed)
Rx for flagyl given.  GC/chlamydia collected.   Zithromax 1000 mg po once given prior to d/c.  Patient has f/up appt in one week.

## 2015-01-10 LAB — CHLAMYDIA/NGC RT PCR (ARMC ONLY)
Chlamydia Tr: NOT DETECTED
N gonorrhoeae: NOT DETECTED

## 2015-01-12 ENCOUNTER — Inpatient Hospital Stay
Admission: EM | Admit: 2015-01-12 | Discharge: 2015-01-17 | DRG: 781 | Disposition: A | Discharge status: Home / Self Care | Payer: Medicaid Other | Attending: Obstetrics and Gynecology | Admitting: Obstetrics and Gynecology

## 2015-01-12 ENCOUNTER — Encounter: Payer: Self-pay | Admitting: *Deleted

## 2015-01-12 DIAGNOSIS — Z9119 Patient's noncompliance with other medical treatment and regimen: Secondary | ICD-10-CM | POA: Diagnosis present

## 2015-01-12 DIAGNOSIS — E1165 Type 2 diabetes mellitus with hyperglycemia: Secondary | ICD-10-CM | POA: Diagnosis present

## 2015-01-12 DIAGNOSIS — Z3A35 35 weeks gestation of pregnancy: Secondary | ICD-10-CM | POA: Diagnosis present

## 2015-01-12 DIAGNOSIS — O24414 Gestational diabetes mellitus in pregnancy, insulin controlled: Principal | ICD-10-CM | POA: Diagnosis present

## 2015-01-12 DIAGNOSIS — O98313 Other infections with a predominantly sexual mode of transmission complicating pregnancy, third trimester: Secondary | ICD-10-CM | POA: Diagnosis present

## 2015-01-12 DIAGNOSIS — O09213 Supervision of pregnancy with history of pre-term labor, third trimester: Secondary | ICD-10-CM

## 2015-01-12 DIAGNOSIS — A599 Trichomoniasis, unspecified: Secondary | ICD-10-CM | POA: Diagnosis present

## 2015-01-12 DIAGNOSIS — D573 Sickle-cell trait: Secondary | ICD-10-CM | POA: Diagnosis present

## 2015-01-12 DIAGNOSIS — O24919 Unspecified diabetes mellitus in pregnancy, unspecified trimester: Secondary | ICD-10-CM

## 2015-01-12 LAB — GLUCOSE, CAPILLARY
GLUCOSE-CAPILLARY: 219 mg/dL — AB (ref 65–99)
Glucose-Capillary: 316 mg/dL — ABNORMAL HIGH (ref 65–99)
Glucose-Capillary: 306 mg/dL — ABNORMAL HIGH (ref 65–99)
Glucose-Capillary: 244 mg/dL — ABNORMAL HIGH (ref 65–99)

## 2015-01-12 LAB — COMPREHENSIVE METABOLIC PANEL
ALBUMIN: 3.1 g/dL — AB (ref 3.5–5.0)
ALK PHOS: 87 U/L (ref 38–126)
ALT: 16 U/L (ref 14–54)
AST: 16 U/L (ref 15–41)
Anion gap: 10 (ref 5–15)
BILIRUBIN TOTAL: 0.3 mg/dL (ref 0.3–1.2)
BUN: 7 mg/dL (ref 6–20)
CALCIUM: 9 mg/dL (ref 8.9–10.3)
CHLORIDE: 101 mmol/L (ref 101–111)
CO2: 20 mmol/L — ABNORMAL LOW (ref 22–32)
Creatinine, Ser: 0.84 mg/dL (ref 0.44–1.00)
GFR calc non Af Amer: >60 mL/min (ref >60)
GLUCOSE: 317 mg/dL — AB (ref 65–99)
Potassium: 3.8 mmol/L (ref 3.5–5.1)
Sodium: 131 mmol/L — ABNORMAL LOW (ref 135–145)
Total Protein: 7.2 g/dL (ref 6.5–8.1)

## 2015-01-12 LAB — URINALYSIS COMPLETE WITH MICROSCOPIC (ARMC ONLY)
BILIRUBIN URINE: NEGATIVE
Glucose, UA: >500 mg/dL — AB
KETONES UR: NEGATIVE mg/dL
NITRITE: NEGATIVE
PROTEIN: 30 mg/dL — AB
SPECIFIC GRAVITY, URINE: 1.02 (ref 1.005–1.030)
pH: 6 (ref 5.0–8.0)

## 2015-01-12 LAB — ABO/RH: ABO/RH(D): O POS

## 2015-01-12 LAB — ANTIBODY SCREEN: Antibody Screen: NEGATIVE

## 2015-01-12 MED ORDER — INSULIN NPH (HUMAN) (ISOPHANE) 100 UNIT/ML ~~LOC~~ SUSP: 20.0000 [IU] | Freq: Once | SUBCUTANEOUS | Status: DC

## 2015-01-12 MED ORDER — INSULIN ASPART 100 UNIT/ML ~~LOC~~ SOLN
28.0000 [IU] | Freq: Every day | SUBCUTANEOUS | Status: DC
  Administered 2015-01-13 – 2015-01-14 (×2): 28 [IU] via SUBCUTANEOUS
  Filled 2015-01-12: qty 28
  Filled 2015-01-12 (×2): qty 0.28
  Filled 2015-01-12: qty 28
  Filled 2015-01-12 (×3): qty 0.28
  Filled 2015-01-12: qty 9
  Filled 2015-01-12: qty 0.28
  Filled 2015-01-12 (×2): qty 9

## 2015-01-12 MED ORDER — INSULIN DETEMIR 100 UNIT/ML ~~LOC~~ SOLN
50.0000 [IU] | Freq: Every day | SUBCUTANEOUS | Status: DC
  Filled 2015-01-12: qty 0.5

## 2015-01-12 MED ORDER — INSULIN NPH (HUMAN) (ISOPHANE) 100 UNIT/ML ~~LOC~~ SUSP
26.0000 [IU] | Freq: Every day | SUBCUTANEOUS | Status: DC
  Filled 2015-01-12 (×6): qty 10

## 2015-01-12 MED ORDER — INSULIN NPH (HUMAN) (ISOPHANE) 100 UNIT/ML ~~LOC~~ SUSP: 50.0000 [IU] | SUBCUTANEOUS | Status: DC

## 2015-01-12 MED ORDER — CALCIUM CARBONATE ANTACID 500 MG PO CHEW: 2.0000 | CHEWABLE_TABLET | ORAL | Status: DC | PRN

## 2015-01-12 MED ORDER — ASPIRIN 81 MG PO CHEW
81.0000 mg | CHEWABLE_TABLET | Freq: Every day | ORAL | Status: DC
  Administered 2015-01-12 – 2015-01-17 (×6): 81 mg via ORAL
  Filled 2015-01-12 (×8): qty 1

## 2015-01-12 MED ORDER — INSULIN REGULAR HUMAN 100 UNIT/ML IJ SOLN: 28.0000 [IU] | Freq: Every day | INTRAMUSCULAR | Status: DC

## 2015-01-12 MED ORDER — INSULIN REGULAR HUMAN 100 UNIT/ML IJ SOLN
22.0000 [IU] | Freq: Every day | INTRAMUSCULAR | Status: DC
Start: 2015-01-12 — End: 2015-01-12

## 2015-01-12 MED ORDER — INSULIN DETEMIR 100 UNIT/ML ~~LOC~~ SOLN
20.0000 [IU] | Freq: Once | SUBCUTANEOUS | Status: AC
  Administered 2015-01-12: 20 [IU] via SUBCUTANEOUS
  Filled 2015-01-12: qty 0.2

## 2015-01-12 MED ORDER — INSULIN ASPART 100 UNIT/ML ~~LOC~~ SOLN
6.0000 [IU] | Freq: Once | SUBCUTANEOUS | Status: AC
  Administered 2015-01-12: 6 [IU] via SUBCUTANEOUS
  Filled 2015-01-12: qty 6
  Filled 2015-01-12: qty 0.06

## 2015-01-12 MED ORDER — INSULIN DETEMIR 100 UNIT/ML ~~LOC~~ SOLN
26.0000 [IU] | Freq: Every day | SUBCUTANEOUS | Status: DC
  Administered 2015-01-13: 26 [IU] via SUBCUTANEOUS
  Filled 2015-01-12 (×2): qty 0.26

## 2015-01-12 MED ORDER — INSULIN REGULAR HUMAN 100 UNIT/ML IJ SOLN: 6.0000 [IU] | Freq: Once | INTRAMUSCULAR | Status: DC

## 2015-01-12 MED ORDER — INSULIN ASPART 100 UNIT/ML ~~LOC~~ SOLN: 4.0000 [IU] | Freq: Three times a day (TID) | SUBCUTANEOUS | Status: DC | PRN

## 2015-01-12 MED ORDER — ZOLPIDEM TARTRATE 5 MG PO TABS: 5.0000 mg | ORAL_TABLET | Freq: Every evening | ORAL | Status: DC | PRN

## 2015-01-12 MED ORDER — PRENATAL MULTIVITAMIN CH
1.0000 | ORAL_TABLET | Freq: Every day | ORAL | Status: DC
  Administered 2015-01-12 – 2015-01-16 (×4): 1 via ORAL
  Filled 2015-01-12 (×9): qty 1

## 2015-01-12 MED ORDER — INSULIN ASPART 100 UNIT/ML ~~LOC~~ SOLN
3.0000 [IU] | Freq: Three times a day (TID) | SUBCUTANEOUS | Status: DC | PRN
  Administered 2015-01-12: 3 [IU] via SUBCUTANEOUS
  Filled 2015-01-12: qty 0.03
  Filled 2015-01-12: qty 3
  Filled 2015-01-12: qty 0.03

## 2015-01-12 MED ORDER — INSULIN NPH (HUMAN) (ISOPHANE) 100 UNIT/ML ~~LOC~~ SUSP
50.0000 [IU] | Freq: Every day | SUBCUTANEOUS | Status: DC
  Filled 2015-01-12: qty 10

## 2015-01-12 MED ORDER — INSULIN ASPART 100 UNIT/ML ~~LOC~~ SOLN
22.0000 [IU] | Freq: Every day | SUBCUTANEOUS | Status: DC
  Administered 2015-01-12 – 2015-01-14 (×2): 22 [IU] via SUBCUTANEOUS
  Filled 2015-01-12 (×3): qty 0.22
  Filled 2015-01-12 (×2): qty 22
  Filled 2015-01-12 (×3): qty 0.22
  Filled 2015-01-12: qty 6
  Filled 2015-01-12 (×2): qty 22
  Filled 2015-01-12: qty 0.22

## 2015-01-12 MED ORDER — INSULIN ASPART 100 UNIT/ML ~~LOC~~ SOLN
10.0000 [IU] | Freq: Once | SUBCUTANEOUS | Status: AC
  Administered 2015-01-12: 10 [IU] via SUBCUTANEOUS
  Filled 2015-01-12: qty 10
  Filled 2015-01-12: qty 0.1

## 2015-01-12 MED ORDER — ACETAMINOPHEN 325 MG PO TABS: 650.0000 mg | ORAL_TABLET | ORAL | Status: DC | PRN

## 2015-01-12 MED ORDER — DOCUSATE SODIUM 100 MG PO CAPS
100.0000 mg | ORAL_CAPSULE | Freq: Every day | ORAL | Status: DC
  Administered 2015-01-13: 100 mg via ORAL
  Filled 2015-01-12 (×5): qty 1

## 2015-01-12 MED ORDER — INSULIN REGULAR HUMAN 100 UNIT/ML IJ SOLN: 22.0000 [IU] | Freq: Every day | INTRAMUSCULAR | Status: DC

## 2015-01-12 MED ORDER — INSULIN NPH (HUMAN) (ISOPHANE) 100 UNIT/ML ~~LOC~~ SUSP: 26.0000 [IU] | Freq: Every day | SUBCUTANEOUS | Status: DC

## 2015-01-12 MED ORDER — INSULIN DETEMIR 100 UNIT/ML ~~LOC~~ SOLN
26.0000 [IU] | Freq: Every day | SUBCUTANEOUS | Status: DC
Start: 2015-01-12 — End: 2015-01-12
  Filled 2015-01-12: qty 0.26

## 2015-01-12 MED ORDER — INSULIN REGULAR HUMAN 100 UNIT/ML IJ SOLN
28.0000 [IU] | Freq: Every day | INTRAMUSCULAR | Status: DC
  Filled 2015-01-12: qty 0.28

## 2015-01-12 NOTE — Progress Notes (Signed)
CBG 316 upon arrival to BP. Dosed with 16 units Novolog and 20 levemir, CBG 306. Rechecked 2hours pp CBG 244. Per Schermerhorn, gave pt 22 units novolog with supper at 1830, [facility]'s therapeutic substitution for MD order (Novolin/regular).   MD order to recheck CBG 2hours pp (2030). And dose according to sliding scale as written.   At 1900, pt has received a total of 58 units Novolog and 20 units Levemir.

## 2015-01-12 NOTE — Progress Notes (Signed)
Patient ID: Victoria Cortez, female   DOB: [birth year], 29 y.o.   MRN: [id number] Pt with glucose of 316 shortly after arriving on L+d . SHw was given 6 units regular and 20 units NPH . LUNCH was given and 10 more units regular. 1 hr PP =306 , next hour 244.  Will administer 26 units levemir with diner and 22 units reg . Cont AC glucose and 2 hr PP . COnt sliding scale . Currently cat 1 stripe A: poorly controlled A2 GDM on insulin  P: cont levemir +reg bid . AM dose , will be 26 levemir and 28 regular  Cont sliding scale   WIll keep here on L+D until glucose levels stabalize

## 2015-01-12 NOTE — H&P (Signed)
[facility]te[facility] Admission Note:  Reason for admission: uncontrolled GDM, noncompliant with care  EDD: 02/16/15  H&P:  Ms Victoria Cortez is a 28yo [id number] @ 35+0wks today with GDM not well controlled on her insulin regimen. The patient has not brought in FS for the last 2 weeks and acknowledges that she often does not eat well and does not check her fingersticks. Her fasting glucose today at clinic was 260, prompting the need to hospitalize her to fine tune her insulin regimen and diet regimen for the remaining weeks of her pregnancy.   Pt denies light headed ness, vaginal bleeding, ctx, LOF. Currently being treated for trichomonas infection. No other complaints.   APC: [facility] 1. Poorly controlled Insulin dependent GDM - Recent a1c = 8.4 % - current regimen 50 N/28 R in AM; 26 N + 22 R in PM - Fetal echo normal at 21 weeks. - Q4 wk growth scans, last 12/22/14 - Breech, AFI 65%, EFW 45% - 2x weekly NST  2. History of preterm delivery - PPROM at 34weeks: On 17P 16-36 wks  3. 2 vessel umbilical cord  Fetal ECHO-wnl 10/11/14   Screening results and needs:  NOB:   MBT  O positive Ab screen negative Pap negative (GC/CH negative) HIV/Hep B/RPR negative  No travel risk  Aneuploidy:   First trimester (Informaseq, NT): late entry transfer  Second trimester (AFP/tetra): negative  28 weeks: Last HgA1C-6.6 (10/27/14)  Hgb:   HgbA1C- 8.4 (12/21/24)  Rhogam: not needed 35 wks  G/C    Last US:   09/14/14-vertex, post plac, normal anatomy, cervical length-5 cm. 11/23/14-breech, post place, AFI-155 mm. 12/22/14-breech, post plac, AFI 177 mm @ 65%   12/22/14- AFI 65% EFW 1860g, 45% breech   Immunization:    Flu in season -   Tdap at 27-36 weeks - given 11/24/14  Social: no changes  Contraception Plan: BTL  Feeding Plan: Considering breastfeeding, but likely bottle   O: LMP 05/12/2014 (Exact Date) OB History  Gravida Para Term Preterm AB SAB TAB Ectopic Multiple Living   3 2 1 1  0 0 0 0 0 2    # Outcome Date GA Lbr Len/2nd Weight Sex Delivery Anes PTL Lv  3 Current           2 Preterm           1 Term              Past Medical History  Diagnosis Date  . Diabetes mellitus without complication   . Sickle cell trait    Past Surgical History  Procedure Laterality Date  . Cesarean section     FHR: Reactive Cat 1 NST 130s mod var + a no d.  Toco: irritableg   A/P:  1. Uncontrolled GDM - cont current insulin regimen in house - sliding scale aspart insulin for 2hr pp FS (3U >200, 4U >250, 5U >300, 6U >350) - diabetic diet - diabetic education  2. Trichomonas infection - cont flagyl 500mg  BID x 7 days  3. Dirty UA, many bacteria - f/u Ucx to r/o UTI  4. OB - FH check Q8H - NST 2x weekly, next due Monday - cont pNV  Ala DachJohanna K Aloys Hupfer, MD

## 2015-01-13 LAB — BASIC METABOLIC PANEL
Anion gap: 11 (ref 5–15)
BUN: 11 mg/dL (ref 6–20)
CHLORIDE: 107 mmol/L (ref 101–111)
CO2: 17 mmol/L — ABNORMAL LOW (ref 22–32)
CREATININE: 0.7 mg/dL (ref 0.44–1.00)
Calcium: 9.1 mg/dL (ref 8.9–10.3)
GFR calc Af Amer: >60 mL/min (ref >60)
Glucose, Bld: 144 mg/dL — ABNORMAL HIGH (ref 65–99)
POTASSIUM: 3.7 mmol/L (ref 3.5–5.1)
Sodium: 135 mmol/L (ref 135–145)

## 2015-01-13 LAB — GLUCOSE, CAPILLARY
GLUCOSE-CAPILLARY: 150 mg/dL — AB (ref 65–99)
GLUCOSE-CAPILLARY: 179 mg/dL — AB (ref 65–99)
GLUCOSE-CAPILLARY: 205 mg/dL — AB (ref 65–99)
GLUCOSE-CAPILLARY: 177 mg/dL — AB (ref 65–99)
Glucose-Capillary: 137 mg/dL — ABNORMAL HIGH (ref 65–99)
Glucose-Capillary: 225 mg/dL — ABNORMAL HIGH (ref 65–99)
Glucose-Capillary: 82 mg/dL (ref 65–99)
Glucose-Capillary: 217 mg/dL — ABNORMAL HIGH (ref 65–99)

## 2015-01-13 LAB — ABO/RH: ABO/RH(D): O POS

## 2015-01-13 MED ORDER — METRONIDAZOLE 500 MG PO TABS
500.0000 mg | ORAL_TABLET | Freq: Two times a day (BID) | ORAL | Status: DC
  Administered 2015-01-13 – 2015-01-17 (×8): 500 mg via ORAL
  Filled 2015-01-13 (×16): qty 1

## 2015-01-13 MED ORDER — POLYETHYLENE GLYCOL 3350 17 G PO PACK
17.0000 g | PACK | Freq: Every day | ORAL | Status: DC
  Administered 2015-01-13 – 2015-01-17 (×5): 17 g via ORAL
  Filled 2015-01-13 (×6): qty 1

## 2015-01-13 MED ORDER — INSULIN ASPART 100 UNIT/ML ~~LOC~~ SOLN
0.0000 [IU] | Freq: Four times a day (QID) | SUBCUTANEOUS | Status: DC
  Administered 2015-01-13: 6 [IU] via SUBCUTANEOUS
  Administered 2015-01-13 – 2015-01-14 (×2): 9 [IU] via SUBCUTANEOUS
  Administered 2015-01-15: 6 [IU] via SUBCUTANEOUS
  Administered 2015-01-15: 3 [IU] via SUBCUTANEOUS
  Filled 2015-01-13: qty 3
  Filled 2015-01-13: qty 6

## 2015-01-13 MED ORDER — INSULIN DETEMIR 100 UNIT/ML ~~LOC~~ SOLN
15.0000 [IU] | Freq: Two times a day (BID) | SUBCUTANEOUS | Status: DC
  Administered 2015-01-13 – 2015-01-14 (×2): 15 [IU] via SUBCUTANEOUS
  Filled 2015-01-13 (×5): qty 0.15

## 2015-01-13 NOTE — Progress Notes (Signed)
Victoria Cortez is a 29 y.o. G3P1102 at 167w1d admitted last night for uncontrolled blood sugars.   Subjective: Sugars ranged from 306 on admission to 137 fasting this morning. We have given her levamir long acting, and have covered her with short active insulin to bring sugars down.  Currently, pt declines to eat lunch. She is interested in delivering, as her prior 34 wk baby is doing just fine.  She is on a diabetic diet while in house.  Objective: BP 111/70 mmHg  Pulse 81  Temp(Src) 97.9 F (36.6 C) (Oral)  Resp 18  Ht 5' (1.524 m)  Wt 87.998 kg (194 lb)  BMI 37.89 kg/m2  SpO2 99%  LMP 05/12/2014 (Exact Date)      FHT:  Reasurring: mod var, +accels, no decel UC:   none      Labs: Lab Results  Component Value Date   WBC 15.4* 11/26/2014   HGB 11.4* 11/26/2014   HCT 34.1* 11/26/2014   MCV 88.3 11/26/2014   PLT 207 11/26/2014    Assessment / Plan: In collaboration with the diabetic coordinators, our plan is: 15u NPH BID as basal 28u R/22u R in morning with breakfast/dinner, respectively. This was her home dose of short-acting.  We will check blood sugars fasting and 2hr postprandial, with goal of fasting between 60-90 and postprandials <120.  Likely 2 day stay in house.   Christeen DouglasBEASLEY, Victoria Cortez 01/13/2015, 12:27 PM

## 2015-01-13 NOTE — Progress Notes (Signed)
Called Dr. Dalbert GarnetBeasley to report held dose of Novolog due to low blood sugars.  Initial blood sugar check was 69.  This value was rejected, and was rechecked with value of 82 received.  MD states to not give ordered 22 units of Novolog, but to give NPH tonight as ordered. Pt ate supper tray and bag of chips. Reynold BowenSusan Paisley Modean Mccullum, RN 01/13/2015 7:33 PM

## 2015-01-13 NOTE — Progress Notes (Signed)
Inpatient Diabetes Program Recommendations   Reason for Visit: elevated CBG, clarify the doses patient was taking at home  Diabetes history: Gestational diabetes Outpatient Diabetes medications: NPH 46 units qam, R insulin 20 units with breakfast, NPH 28 units qpm, R insulin 26 units with supper  Current orders for Inpatient glycemic control: Levemir 26 units qam, Novolog insulin 28 units with 1st meal(breakfast), 22 units with supper, Novolog 2 hour post prandial correction insulin 3units for blood sugars greater than 200mg /dl, Novolog 4units if blood sugars > 250mg /dl  Please consider using the "Diabetic pregnant patient" order set to control blood sugars- suggest custom times 4x/day, fasting, and 2 hours after meals ~1000, 1400, 1900   NPH long acting insulin was being used bid by the patient at home and is the recommended long acting insulin choice by the ADA.  Levemir has also been approved as a class B drug for pregnancy.  If you chose to change the patient to NPH, recommend 15 units NPH bid starting tomorrow am.  Please feel free to call our team at the numbers below.   Susette RacerJulie Devanie Galanti, RN, BA, MHA, CDE Diabetes Coordinator Inpatient Diabetes Program  [phone] (Team Pager) [phone] [facility] Office) 01/13/2015 11:50 AM

## 2015-01-13 NOTE — Progress Notes (Signed)
Spoke with Dr. Dalbert GarnetBeasley to clarify insulin orders and to review 1531 blood sugar and dose of insulin given.  MD states to give 22 units now, and to check blood sugar 2 hours after dinner. Reynold BowenSusan Paisley Hallel Denherder, RN 01/13/2015 6:27 PM

## 2015-01-13 NOTE — Progress Notes (Signed)
Patient ID: Victoria Cortez, female   DOB: 1986/05/25, 29 y.o.   MRN: [id number]   [facility], UA with moderate bacteria, 10-50 WBC, >1000 glucose, >30 protein, and many squamous epithelial cells, as well as many trichomonas present. Reviewing ED visit 7.11.16, pt dx w/ trich infection at that time and ordered flagyl. Will continue flagyl in house and order Urine culture to evaluate for concurrent UTI.   Ala DachJohanna K Halfon, MD

## 2015-01-13 NOTE — Progress Notes (Signed)
Initial Nutrition Assessment    INTERVENTION:   Nutrition Diet education:   RD provided "Carbohydrate Counting for People with Diabetes" handout from the Academy of Nutrition and Dietetics. Discussed different food groups and their effects on blood sugar, emphasizing carbohydrate-containing foods. Provided list of carbohydrates and recommended serving sizes of common foods.  Discussed importance of controlled and consistent carbohydrate intake throughout the day. Provided examples of ways to balance meals/snacks and encouraged intake of high-fiber, whole grain complex carbohydrates. Teach back method used.  Expect fair compliance.    NUTRITION DIAGNOSIS:   Food and nutrition related knowledge deficit related to other (see comment) (gestational diabetes) as evidenced by  (elevated blood glucose and not eating low carb foods).    GOAL:   Patient will meet greater than or equal to 90% of their needs    MONITOR:    (Energy intake, glucose profile)  REASON FOR ASSESSMENT:   Gestational Diabetes    ASSESSMENT:   Pt admitted with uncontrolled blood glucose  Past Medical History  Diagnosis Date  . Diabetes mellitus without complication   . Sickle cell trait     Current Nutrition: tolerating meals, wanting to eat potato chips that are in pocket book.  Food/Nutrition-Related History: reports eating sporadically prior to admission.  Has been craving fruit   Medications: aspart, levimir, prenatal MVI, colace  Electrolyte/Renal Profile and Glucose Profile:   Recent Labs Lab 01/12/15 1451 01/13/15 0537  NA 131* 135  K 3.8 3.7  CL 101 107  CO2 20* 17*  BUN 7 11  CREATININE 0.84 0.70  CALCIUM 9.0 9.1  GLUCOSE 317* 144*   Protein Profile:  Recent Labs Lab 01/12/15 1451  ALBUMIN 3.1*     Diet Order:  Diet Carb Modified Fluid consistency:: Thin; Room service appropriate?: Yes  Skin:  Reviewed, no issues   Height:   Ht Readings from Last 1 Encounters:   01/12/15 5' (1.524 m)    Weight:   Wt Readings from Last 1 Encounters:  01/12/15 194 lb (87.998 kg)         Wt Readings from Last 10 Encounters:  01/12/15 194 lb (87.998 kg)  11/26/14 195 lb (88.451 kg)    BMI:  Body mass index is 37.89 kg/(m^2).   EDUCATION NEEDS:   Education needs addressed  LOW Care Level Annaston Upham B. Freida BusmanAllen, RD, LDN [phone] (pager)

## 2015-01-14 LAB — GLUCOSE, CAPILLARY
GLUCOSE-CAPILLARY: 155 mg/dL — AB (ref 65–99)
GLUCOSE-CAPILLARY: 84 mg/dL (ref 65–99)
GLUCOSE-CAPILLARY: 79 mg/dL (ref 65–99)
Glucose-Capillary: 128 mg/dL — ABNORMAL HIGH (ref 65–99)
Glucose-Capillary: 217 mg/dL — ABNORMAL HIGH (ref 65–99)
Glucose-Capillary: 132 mg/dL — ABNORMAL HIGH (ref 65–99)
Glucose-Capillary: 208 mg/dL — ABNORMAL HIGH (ref 65–99)
Glucose-Capillary: 154 mg/dL — ABNORMAL HIGH (ref 65–99)

## 2015-01-14 MED ORDER — INSULIN DETEMIR 100 UNIT/ML ~~LOC~~ SOLN
18.0000 [IU] | Freq: Two times a day (BID) | SUBCUTANEOUS | Status: DC
  Administered 2015-01-14 – 2015-01-15 (×2): 18 [IU] via SUBCUTANEOUS
  Filled 2015-01-14 (×4): qty 0.18

## 2015-01-14 NOTE — Progress Notes (Signed)
Sliding Scale Novolog order held for NBS of 154 at this time.  Levemir given as per Scheduled order and verbal confirmation by Dr. Dalbert GarnetBeasley. Dr. Dalbert GarnetBeasley stated she would increase Pt.'s Dinner Insulin Tomorrow.

## 2015-01-14 NOTE — Progress Notes (Signed)
Victoria Cortez is a 29 y.o. [id number] at 4175w2d admitted for blood sugar control.  Subjective: Blood sugars are improving. Now on 15/18 of regular (Levemir) and 26/22 log with no lunch coverage because wasn't eating at home.   Today: 0800 128 fasting, then breakfast with 28u log 1100 84 2hr postprandial, *symptomatic 1200 79, then lunch and 15u regular 1430 217 2hr postprandial 1800 132 predinner. Then dinner and 22u log  NST reactive today  Objective: BP 95/51 mmHg  Pulse 75  Temp(Src) 98.4 F (36.9 C) (Oral)  Resp 20  Ht 5' (1.524 m)  Wt 87.998 kg (194 lb)  BMI 37.89 kg/m2  SpO2 100%  LMP 05/12/2014 (Exact Date)      Labs: Lab Results  Component Value Date   WBC 15.4* 11/26/2014   HGB 11.4* 11/26/2014   HCT 34.1* 11/26/2014   MCV 88.3 11/26/2014   PLT 207 11/26/2014    Assessment / Plan:  Uncontrolled g DMA2: Decrease am Short acting to 26u  Add 15u short acting to lunch time  Diabetes educator and dietian if still present on Monday. Monday will have ultrasound as scheduled.  Long discussion of risks of uncontrolled blood sugar, need for insulin at home, and dietary counseling today. Pt may be able to go home if can follow plan at home.  Hx of PTD: 17-P this Thursday  Daily NSTs  Christeen DouglasBEASLEY, Jissel Slavens 01/14/2015, 6:58 PM

## 2015-01-14 NOTE — Plan of Care (Signed)
Problem: Phase I Progression Outcomes Goal: Contractions < 5-6/hour Outcome: Completed/Met Date Met:  01/14/15 Denies Contractions,Abd. Is soft. Goal: Maintains reassuring Fetal Heart Rate Outcome: Progressing Fetal Heart rate WNL. Assessed Q8h as per order. Mother states Infant is moving and denies contractions. Infant was moving during [place] assessment. Goal: Pain controlled with appropriate interventions Outcome: Not Applicable Date Met:  47/07/61 Denies Pain.  Problem: Phase II Progression Outcomes Goal: Electronic fetal monitoring(Doppler,Continuous,Intermittent) EFM (Doppler, Continuous, Intermittent)  Outcome: Progressing QD NST. Fetal Heart Tones and rate Q8h have been WNL.

## 2015-01-14 NOTE — Progress Notes (Signed)
Contacted Dr. Dalbert GarnetBeasley to notify of fasting blood sugar of 128, and to verify that ordered 28 units of Novolog and Levimir should be given.  MD states to give ordered doses of insulin. Reynold BowenSusan Paisley Raymel Cull, RN 01/14/2015 8:41 AM

## 2015-01-14 NOTE — Progress Notes (Signed)
Patient ID: Victoria Cortez, female   DOB: 03-23-1986, 29 y.o.   MRN: [id number]  Blood sugar and insulin log for past 24 hrs: SSI=sliding scale insulin given  Friday, 7/15- 0800 137 1000 225 --> 28 log given as scheduled 1130 179 1400 205--> 9 log SSI given 1830 82  After dinner scheduled dose of 28U log held 2000 177-->6 log SSI given 2230 217  Scheduled long acting levemir given, 15U 0200 155  Will coordinate with the diabetic coordinator and adjust insulin doses. I am concerned that her BG dropped to 82 after 9u, but has remained high otherwise. Will continue to modulate based on her response. She is on a carb counting diabetic diet while in house, so she is a candidate for carb counting while in house, but I would like to get a regimen that she will follow at home. She will not carb count at home.

## 2015-01-15 LAB — GLUCOSE, CAPILLARY
GLUCOSE-CAPILLARY: 107 mg/dL — AB (ref 65–99)
GLUCOSE-CAPILLARY: 117 mg/dL — AB (ref 65–99)
Glucose-Capillary: 116 mg/dL — ABNORMAL HIGH (ref 65–99)
Glucose-Capillary: 245 mg/dL — ABNORMAL HIGH (ref 65–99)
Glucose-Capillary: 179 mg/dL — ABNORMAL HIGH (ref 65–99)
Glucose-Capillary: 88 mg/dL (ref 65–99)
Glucose-Capillary: 72 mg/dL (ref 65–99)
Glucose-Capillary: 87 mg/dL (ref 65–99)

## 2015-01-15 LAB — URINE CULTURE

## 2015-01-15 MED ORDER — INSULIN ASPART PROT & ASPART (70-30 MIX) 100 UNIT/ML ~~LOC~~ SUSP
26.0000 [IU] | Freq: Every day | SUBCUTANEOUS | Status: DC
  Filled 2015-01-15: qty 10

## 2015-01-15 MED ORDER — INSULIN ASPART 100 UNIT/ML ~~LOC~~ SOLN
28.0000 [IU] | Freq: Every day | SUBCUTANEOUS | Status: DC
  Administered 2015-01-16: 28 [IU] via SUBCUTANEOUS
  Filled 2015-01-15: qty 28

## 2015-01-15 MED ORDER — INSULIN ASPART 100 UNIT/ML ~~LOC~~ SOLN: 25.0000 [IU] | Freq: Every day | SUBCUTANEOUS | Status: DC

## 2015-01-15 MED ORDER — INSULIN ASPART 100 UNIT/ML ~~LOC~~ SOLN
26.0000 [IU] | Freq: Once | SUBCUTANEOUS | Status: AC
  Administered 2015-01-15: 26 [IU] via SUBCUTANEOUS
  Filled 2015-01-15: qty 26

## 2015-01-15 MED ORDER — INSULIN ASPART 100 UNIT/ML ~~LOC~~ SOLN
22.0000 [IU] | Freq: Every day | SUBCUTANEOUS | Status: DC
  Administered 2015-01-15 – 2015-01-16 (×2): 22 [IU] via SUBCUTANEOUS
  Filled 2015-01-15: qty 22

## 2015-01-15 MED ORDER — INSULIN DETEMIR 100 UNIT/ML ~~LOC~~ SOLN
20.0000 [IU] | Freq: Two times a day (BID) | SUBCUTANEOUS | Status: DC
  Administered 2015-01-15 – 2015-01-16 (×2): 20 [IU] via SUBCUTANEOUS
  Filled 2015-01-15 (×4): qty 0.2

## 2015-01-15 MED ORDER — INSULIN ASPART PROT & ASPART (70-30 MIX) 100 UNIT/ML ~~LOC~~ SUSP
22.0000 [IU] | Freq: Every day | SUBCUTANEOUS | Status: DC
  Filled 2015-01-15: qty 10

## 2015-01-15 MED ORDER — INSULIN ASPART 100 UNIT/ML ~~LOC~~ SOLN
15.0000 [IU] | Freq: Once | SUBCUTANEOUS | Status: AC
  Administered 2015-01-15: 15 [IU] via SUBCUTANEOUS
  Filled 2015-01-15: qty 15

## 2015-01-15 NOTE — Progress Notes (Signed)
Contacted Dr. Dalbert GarnetBeasley due to blood sugar of 72 with ordered dose of 25 units Humalog.  MD states to not give 25 units.  Will place order for 18 units of Humalog. Reynold BowenSusan Paisley Warnie Belair, RN 01/15/2015 5:24 PM

## 2015-01-15 NOTE — Progress Notes (Signed)
Patient ID: Victoria Cortez, female   DOB: 1985/07/10, 29 y.o.   MRN: [id number] FACULTY PRACTICE ANTEPARTUM COMPREHENSIVE PROGRESS NOTE  Victoria Cortez is a 29 y.o. [id number] at [redacted]w[redacted]d who is admitted for uncontrolled diabetes.  Estimated Date of Delivery: 02/16/15  Length of Stay:  3 Days. Admitted 01/12/2015  Subjective: Sugars still elevated with one bout of symptomatic normal sugars yesterday. Currently on 18/18 of regular at 0900 and 2100. Yesterday I decreased her morning short-acting from 28 to 26u but this morning her 2hr postprandial breakfast was again elevated.  NST remains reassuring.  Patient reports good fetal movement.  She reports no uterine contractions, no bleeding and no loss of fluid per vagina.  Vitals:  Blood pressure 105/67, pulse 68, temperature 98 F (36.7 C), temperature source Oral, resp. rate 16, height 5' (1.524 m), weight 87.998 kg (194 lb), last menstrual period 05/12/2014, SpO2 100 %. Physical Examination: CONSTITUTIONAL: Well-developed, well-nourished female in no acute distress.  HENT:  Normocephalic, atraumatic, External right and left ear normal. Oropharynx is clear and moist EYES: Conjunctivae and EOM are normal. Pupils are equal, round, and reactive to light. No scleral icterus.  NECK: Normal range of motion, supple, no masses SKIN: Skin is warm and dry. No rash noted. Not diaphoretic. No erythema. No pallor. NEUROLGIC: Alert and oriented to person, place, and time. Normal reflexes, muscle tone coordination. No cranial nerve deficit noted. PSYCHIATRIC: Normal mood and affect. Normal behavior. Normal judgment and thought content. CARDIOVASCULAR: Normal heart rate noted, regular rhythm RESPIRATORY: Effort and breath sounds normal, no problems with respiration noted MUSCULOSKELETAL: Normal range of motion. No edema and no tenderness. 2+ distal pulses. ABDOMEN: Soft, nontender, nondistended, gravid. CERVIX:    Fetal monitoring: FHR: 145 bpm,  Variability: moderate, Accelerations: Present, Decelerations: Absent  Uterine activity: 1 contraction per hour  Results for orders placed or performed during the hospital encounter of 01/12/15 (from the past 48 hour(s))  Glucose, capillary     Status: Abnormal   Collection Time: 01/13/15  2:01 PM  Result Value Ref Range   Glucose-Capillary 205 (H) 65 - 99 mg/dL  Glucose, capillary     Status: None   Collection Time: 01/13/15  6:33 PM  Result Value Ref Range   Glucose-Capillary 82 65 - 99 mg/dL  Glucose, capillary     Status: Abnormal   Collection Time: 01/13/15  7:52 PM  Result Value Ref Range   Glucose-Capillary 177 (H) 65 - 99 mg/dL  Glucose, capillary     Status: Abnormal   Collection Time: 01/13/15 10:22 PM  Result Value Ref Range   Glucose-Capillary 217 (H) 65 - 99 mg/dL  Glucose, capillary     Status: Abnormal   Collection Time: 01/14/15  2:22 AM  Result Value Ref Range   Glucose-Capillary 155 (H) 65 - 99 mg/dL  Glucose, capillary     Status: Abnormal   Collection Time: 01/14/15  8:02 AM  Result Value Ref Range   Glucose-Capillary 128 (H) 65 - 99 mg/dL  Glucose, capillary     Status: None   Collection Time: 01/14/15 11:12 AM  Result Value Ref Range   Glucose-Capillary 84 65 - 99 mg/dL  Glucose, capillary     Status: None   Collection Time: 01/14/15 12:16 PM  Result Value Ref Range   Glucose-Capillary 79 65 - 99 mg/dL  Glucose, capillary     Status: Abnormal   Collection Time: 01/14/15  2:30 PM  Result Value Ref Range   Glucose-Capillary 217 (H)  65 - 99 mg/dL  Glucose, capillary     Status: Abnormal   Collection Time: 01/14/15  6:13 PM  Result Value Ref Range   Glucose-Capillary 132 (H) 65 - 99 mg/dL  Glucose, capillary     Status: Abnormal   Collection Time: 01/14/15  8:05 PM  Result Value Ref Range   Glucose-Capillary 208 (H) 65 - 99 mg/dL  Glucose, capillary     Status: Abnormal   Collection Time: 01/14/15 10:02 PM  Result Value Ref Range   Glucose-Capillary  154 (H) 65 - 99 mg/dL  Glucose, capillary     Status: Abnormal   Collection Time: 01/15/15  7:51 AM  Result Value Ref Range   Glucose-Capillary 116 (H) 65 - 99 mg/dL  Glucose, capillary     Status: Abnormal   Collection Time: 01/15/15  9:27 AM  Result Value Ref Range   Glucose-Capillary 245 (H) 65 - 99 mg/dL  Glucose, capillary     Status: Abnormal   Collection Time: 01/15/15 11:06 AM  Result Value Ref Range   Glucose-Capillary 179 (H) 65 - 99 mg/dL    No results found.  Current scheduled medications . aspirin  81 mg Oral Daily  . docusate sodium  100 mg Oral Daily  . insulin aspart  0-24 Units Subcutaneous QID  . insulin aspart  22 Units Subcutaneous Q supper  . insulin detemir  18 Units Subcutaneous BID AC & HS  . metroNIDAZOLE  500 mg Oral Q12H  . polyethylene glycol  17 g Oral Daily  . prenatal multivitamin  1 tablet Oral Q supper    I have reviewed the patient's current medications.  ASSESSMENT: Patient Active Problem List   Diagnosis Date Noted  . Uncontrolled diabetes mellitus 01/12/2015  . Labor and delivery, indication for care 01/09/2015  . Pregnancy 11/26/2014    PLAN: Increase basal insulin from 18 to 20u (this should be NPH, but levemir is pharmacy substituted) - give BID per pharm recs Add lunch coverage of 22u short acting, based on sliding scale coverage needs because of elevated sugars yesterday. Increase breakfast coverage back to 26u short acting  Continue NSTs daily  Will consult endocrine if needed during their on call hours, which is weekdays only.   Continue routine antenatal care.

## 2015-01-15 NOTE — Progress Notes (Signed)
Patient ID: Victoria Cortez, female   DOB: 12/27/1985, 29 y.o.   MRN: [id number]   Currently, insulin regimen regimen:  Long acting, Levemir, split in two doses q12 hrs per pharmacy recs: 20/20 Shortacting, Novolog, 28/22/25u with breakfast, lunch and dinner, respectively.  She is still needed SSI, and will continue to increase insulin as needed.

## 2015-01-16 ENCOUNTER — Inpatient Hospital Stay: Payer: Medicaid Other

## 2015-01-16 LAB — GLUCOSE, CAPILLARY
GLUCOSE-CAPILLARY: 103 mg/dL — AB (ref 65–99)
GLUCOSE-CAPILLARY: 171 mg/dL — AB (ref 65–99)
GLUCOSE-CAPILLARY: 144 mg/dL — AB (ref 65–99)
Glucose-Capillary: 118 mg/dL — ABNORMAL HIGH (ref 65–99)
Glucose-Capillary: 123 mg/dL — ABNORMAL HIGH (ref 65–99)

## 2015-01-16 MED ORDER — INSULIN ASPART 100 UNIT/ML ~~LOC~~ SOLN
26.0000 [IU] | Freq: Every day | SUBCUTANEOUS | Status: DC
  Administered 2015-01-16: 26 [IU] via SUBCUTANEOUS
  Filled 2015-01-16: qty 26

## 2015-01-16 MED ORDER — INSULIN ASPART 100 UNIT/ML ~~LOC~~ SOLN
24.0000 [IU] | Freq: Every day | SUBCUTANEOUS | Status: DC
  Administered 2015-01-17: 24 [IU] via SUBCUTANEOUS
  Filled 2015-01-16: qty 24

## 2015-01-16 MED ORDER — INSULIN ASPART 100 UNIT/ML ~~LOC~~ SOLN
30.0000 [IU] | Freq: Every day | SUBCUTANEOUS | Status: DC
  Administered 2015-01-17: 30 [IU] via SUBCUTANEOUS
  Filled 2015-01-16: qty 30

## 2015-01-16 MED ORDER — INSULIN DETEMIR 100 UNIT/ML ~~LOC~~ SOLN: 24.0000 [IU] | Freq: Every day | SUBCUTANEOUS | Status: DC

## 2015-01-16 MED ORDER — INSULIN DETEMIR 100 UNIT/ML ~~LOC~~ SOLN
22.0000 [IU] | Freq: Every day | SUBCUTANEOUS | Status: DC
  Filled 2015-01-16 (×2): qty 0.22

## 2015-01-16 MED ORDER — INSULIN DETEMIR 100 UNIT/ML ~~LOC~~ SOLN
20.0000 [IU] | Freq: Every day | SUBCUTANEOUS | Status: DC
  Filled 2015-01-16: qty 0.2

## 2015-01-16 MED ORDER — INSULIN DETEMIR 100 UNIT/ML ~~LOC~~ SOLN
24.0000 [IU] | Freq: Two times a day (BID) | SUBCUTANEOUS | Status: DC
  Administered 2015-01-16 – 2015-01-17 (×2): 24 [IU] via SUBCUTANEOUS
  Filled 2015-01-16 (×4): qty 0.24

## 2015-01-16 MED ORDER — INSULIN DETEMIR 100 UNIT/ML ~~LOC~~ SOLN
22.0000 [IU] | Freq: Two times a day (BID) | SUBCUTANEOUS | Status: DC
Start: 2015-01-16 — End: 2015-01-16
  Filled 2015-01-16 (×2): qty 0.22

## 2015-01-16 MED ORDER — INSULIN DETEMIR 100 UNIT/ML ~~LOC~~ SOLN: 24.0000 [IU] | Freq: Two times a day (BID) | SUBCUTANEOUS | Status: DC

## 2015-01-16 MED ORDER — INSULIN DETEMIR 100 UNIT/ML ~~LOC~~ SOLN
24.0000 [IU] | Freq: Two times a day (BID) | SUBCUTANEOUS | Status: DC
  Filled 2015-01-16 (×2): qty 0.24

## 2015-01-16 NOTE — Progress Notes (Signed)
Patient ID: Victoria Cortez, female   DOB: [birth year], 29 y.o.   MRN: [id number] Here  For glucose profiling . HD #4  Diabetic training today   received a total of 110 units insulin  yesterday . yest had a glucose of  245 level and a 179.  NO c/o  O : VSS  Lungs CTA  CV RRR  ABD soft NT  Gravid  A: still suboptimal control of DM  P: NST today  Will change insulin to Levemir 24 units BID  Novalog Reg 30 am , 24 lunch 26 dinner She will get fasting , ac + 2 hr PP levels and 0200 level  Plan on d/c tomorrow  [facility] before d/c tomorrow

## 2015-01-16 NOTE — Progress Notes (Signed)
Spoke to Dr. Dalbert GarnetBeasley to report fasting blood glucose of 103 this morning.  She stated that she will cancel the order for sliding scale coverage but wants us to give Novolog 28 units and Levemir 20 units.

## 2015-01-16 NOTE — Progress Notes (Signed)
RD consult for diet education.    Spoke with pt this am and no further questions or concerns regarding diet education that was discussed with her by this writer on Friday, 7/15 (see full note dated 7/15)  Provided pt a new copy of diet education materials as spilled something on copy given on 7/15.  Please re-consult RD if further intervention warranted  Dyllan Hughett B. Freida BusmanAllen, RD, LDN [phone] (pager)

## 2015-01-16 NOTE — Progress Notes (Signed)
Patient ID: Victoria Cortez, female   DOB: 1986/03/13, 29 y.o.   MRN: [id number]  Based on a fasting sugar this morning of 103, with our goal <90, I have increased her evening long-acting to 22U from 20u. I will leave morning long-acting dose the same, as her pre-dinner glucose yesterday was 72 and in the appropriate range.

## 2015-01-16 NOTE — Progress Notes (Signed)
Inpatient Diabetes Program Recommendations  Results for Victoria Cortez, Victoria Cortez (MRN [id number]) as of 01/16/2015 15:56  Ref. Range 01/16/2015 07:59 01/16/2015 12:05 01/16/2015 13:47  Glucose-Capillary Latest Ref Range: 65-99 mg/dL 103 (H) 171 (H) 144 (H)     Reason for Visit: Referral received.   Diabetes history: GDM Outpatient Diabetes medications: Prior to admit NPH 50 units/Regular 28 units with breakfast and NPH 26 units/Regular 22 units q PM   Current orders for Inpatient glycemic control:  Levemir 22 units q AM, Levemir 20 units q HS Novolog 28 units with breakfast/22 units with lunch/25 units with supper  Patient states that her blood sugars were well controlled prior to her 3rd trimester.  Briefly discussed the complications of gestational diabetes and the reasons for good glucose control. She states the is testing every 2 hours at home.  Reviewed goals of blood sugars during pregnancy:  Fasting<95 mg/dL, 1 hour post-prandial<140 mg/dL and 2 hour post-prandial<120 mg/dL.  She verbalized understanding.  Briefly discussed the importance of dietary/CHO gestational diet to control blood sugars.  She admits to sometimes snacking on chips and crackers.  Gave patient booklet on "Gestational Diabetes" which includes management, monitoring and dietary information.  Note that prior to admit, patient was not covering her lunch time meal.  Reviewed hypoglycemia signs and symptoms and treatment also.  She states that [facility]  Explained importance of continued monitoring so that adjustments can be made to medications. Discussed with RN.  Needs very close follow-up with MD regarding insulin adjustments.  Thanks, Adah Perl, RN, BC-ADM Inpatient Diabetes Coordinator Pager [phone] (8a-5p)

## 2015-01-17 LAB — GLUCOSE, CAPILLARY
Glucose-Capillary: 160 mg/dL — ABNORMAL HIGH (ref 65–99)
Glucose-Capillary: 83 mg/dL (ref 65–99)
Glucose-Capillary: 108 mg/dL — ABNORMAL HIGH (ref 65–99)

## 2015-01-17 MED ORDER — INSULIN ASPART 100 UNIT/ML ~~LOC~~ SOLN: 24.0000 [IU] | Freq: Every day | SUBCUTANEOUS | Status: DC

## 2015-01-17 MED ORDER — INSULIN REGULAR HUMAN 100 UNIT/ML IJ SOLN: 26.0000 [IU] | Freq: Every day | INTRAMUSCULAR | Status: DC

## 2015-01-17 MED ORDER — METRONIDAZOLE 500 MG PO TABS: 500.0000 mg | ORAL_TABLET | Freq: Two times a day (BID) | ORAL | Status: DC

## 2015-01-17 MED ORDER — INSULIN NPH (HUMAN) (ISOPHANE) 100 UNIT/ML ~~LOC~~ SUSP: 24.0000 [IU] | Freq: Every day | SUBCUTANEOUS | Status: DC

## 2015-01-17 MED ORDER — INSULIN REGULAR HUMAN 100 UNIT/ML IJ SOLN: 24.0000 [IU] | Freq: Every day | INTRAMUSCULAR | Status: DC

## 2015-01-17 MED ORDER — INSULIN ASPART 100 UNIT/ML ~~LOC~~ SOLN: 26.0000 [IU] | Freq: Every day | SUBCUTANEOUS | Status: DC

## 2015-01-17 MED ORDER — INSULIN ASPART 100 UNIT/ML ~~LOC~~ SOLN: 30.0000 [IU] | Freq: Every day | SUBCUTANEOUS | Status: DC

## 2015-01-17 MED ORDER — INSULIN REGULAR HUMAN 100 UNIT/ML IJ SOLN: 30.0000 [IU] | Freq: Every day | INTRAMUSCULAR | Status: DC

## 2015-01-17 MED ORDER — ASPIRIN 81 MG PO CHEW: 81.0000 mg | CHEWABLE_TABLET | Freq: Every day | ORAL | Status: DC

## 2015-01-17 MED ORDER — INSULIN DETEMIR 100 UNIT/ML ~~LOC~~ SOLN: 24.0000 [IU] | Freq: Two times a day (BID) | SUBCUTANEOUS | Status: DC

## 2015-01-17 NOTE — Discharge Summary (Signed)
Obstetric Discharge Summary Reason for Admission: poorly controlled diabetic Prenatal Procedures: NST Intrapartum Procedures: Diabetic education, nutrional education, management of sugars and insulin Postpartum Procedures: none Complications-Operative and Postpartum: none HEMOGLOBIN  Date Value Ref Range Status  11/26/2014 11.4* 12.0 - 16.0 g/dL Final  21/30/865703/09/2014 84.613.5 g/dL Final   HGB  Date Value Ref Range Status  01/24/2014 13.0 12.0-16.0 g/dL Final   HCT  Date Value Ref Range Status  11/26/2014 34.1* 35.0 - 47.0 % Final  09/02/2014 34 % Final  01/24/2014 39.6 35.0-47.0 % Final    Physical Exam:  General: alert HEART: Pulse is regular, no edema or peripheral issues, No JVD Discharge Diagnoses: High Risk Insulin Dep Diabetic poorly controlled  Discharge Information: Date: 01/17/2015 Activity: unrestricted Diet: routine and per nutritional education Medications: PNV, NPH 24 units in am and pm. Humalog Reg 30 u in am with NPH, 24 units at lunch of Regular, 26 units at dinner of Regular,  Condition: stable Instructions: fu at office [facility] on Thurs Discharge to: home   Newborn Data: This patient has no babies on file. Home with antpartum status still .  Milon ScoreJONES, [place] Gandolfo W 01/17/2015, 12:34 PM

## 2015-01-17 NOTE — Progress Notes (Signed)
Confirmed with Yetta Barre. Jones CNM that patient is to be discharged home today with follow up appointment for Thursday January 19, 2015 at 3:30 with Dr. Dalbert GarnetBeasley.  Reviewed d/c instructions with patient along with printed instructions for insulin therapy from C. Jones CNM.  Patient d/c home.

## 2015-01-17 NOTE — Discharge Instructions (Signed)
Pt to take the following: NPH insulin 24 units in the am along with Humalog Regular 30 units in am SQ Lunch: Take 24 units of Humalog Regular SQ Dinner: Take 24 units of Humalog Regular SQ Take NPH 24 units in pm Bring every documented glucose reading to appt on Thursday. Call office at [phone] [facility]) and get appt with MD Also needs NST on Friday

## 2015-01-30 NOTE — H&P (Signed)
Victoria Cortez is a 29 y.o. [id number] with IUP at term with EDC of 02/16/15 at 38+1wks presenting for presenting for scheduled repeat x3 cesarean section and bilateral tubal ligation.  No acute concerns.   Prenatal Course Source of Care: [facility] Pregnancy complications or risks: Patient Active Problem List   Diagnosis Date Noted  . Uncontrolled diabetes mellitus 01/12/2015  . Labor and delivery, indication for care 01/09/2015  . Pregnancy 11/26/2014   Pt was admitted at 35 weeks for uncontrolled diabetes, with a random glucose of 260 in the office. A1C is 8.4%. Fetal echo normal at 22wks. Has been getting normal NST 2x weekly. Last scan 01/16/15 - breech presentation,  with AFI of 14.8cm. Growth scan 12/25/14 EFW 45%.  Her insulin regimen was optimized while in house and she was discharged on 110u of insulin daily.  She plans to breastfeed, plans to bottle feed She desires bilateral tubal ligation for postpartum contraception.   Prenatal labs and studies: ABO, Rh: --/--/O POS (07/14 1247) Antibody: NEG (07/14 1246) Rubella: Immune (03/04 0000) RPR: Nonreactive (06/07 0000)  HBsAg: Negative (03/04 0000)  HIV: Non-reactive (03/04 0000)  GBS:   Genetic screening late entry to care- no first trimester screening. QUAD screen negative Anatomy US normal   Past Medical History  Diagnosis Date  . Diabetes mellitus without complication   . Sickle cell trait     Past Surgical History  Procedure Laterality Date  . Cesarean section      OB History  Gravida Para Term Preterm AB SAB TAB Ectopic Multiple Living  3 2 1 1  0 0 0 0 0 2    # Outcome Date GA Lbr Len/2nd Weight Sex Delivery Anes PTL Lv  3 Current           2 Preterm           1 Term               History   Social History  . Marital Status: Single    Spouse Name: N/A  . Number of Children: N/A  . Years of Education: N/A   Social History Main Topics  . Smoking status: Current Every Day Smoker  . Smokeless  tobacco: Never Used  . Alcohol Use: No  . Drug Use: No  . Sexual Activity: Not on file   Other Topics Concern  . Not on file   Social History Narrative    No family history on file.  No prescriptions prior to admission    No Known Allergies  Review of Systems: Negative except for what is mentioned in HPI.  Physical Exam: LMP 05/12/2014 (Exact Date)  CONSTITUTIONAL: Well-developed, well-nourished female in no acute distress.  HENT:  Normocephalic, atraumatic, External right and left ear normal. Oropharynx is clear and moist EYES: Conjunctivae and EOM are normal. Pupils are equal, round, and reactive to light. No scleral icterus.  NECK: Normal range of motion, supple, no masses SKIN: Skin is warm and dry. No rash noted. Not diaphoretic. No erythema. No pallor. NEUROLGIC: Alert and oriented to person, place, and time. Normal reflexes, muscle tone coordination. No cranial nerve deficit noted. PSYCHIATRIC: Normal mood and affect. Normal behavior. Normal judgment and thought content. CARDIOVASCULAR: Normal heart rate noted, regular rhythm RESPIRATORY: Effort and breath sounds normal, no problems with respiration noted ABDOMEN: Soft, nontender, nondistended, gravid. Well-healed Pfannenstiel incision. PELVIC: Deferred MUSCULOSKELETAL: Normal range of motion. No edema and no tenderness. 2+ distal pulses.   Pertinent Labs/Studies:  No results found for this or any previous visit (from the past 72 hour(s)).  Assessment and Plan :Victoria Cortez is a 29 y.o. [id number] at 38+1d being admitted being admitted for scheduled repeat cesarean section and permanent sterilization. The risks of cesarean section discussed with the patient included but were not limited to: bleeding which may require transfusion or reoperation; infection which may require antibiotics; injury to bowel, bladder, ureters or other surrounding organs; injury to the fetus; need for additional procedures including  hysterectomy in the event of a life-threatening hemorrhage; placental abnormalities wth subsequent pregnancies, incisional problems, thromboembolic phenomenon and other postoperative/anesthesia complications. The patient concurred with the proposed plan, giving informed written consent for the procedure. Patient has been NPO since last night she will remain NPO for procedure. Anesthesia and OR aware. Preoperative prophylactic antibiotics and SCDs ordered on call to the OR. To OR when ready.

## 2015-02-02 ENCOUNTER — Encounter
Admission: RE | Admit: 2015-02-02 | Discharge: 2015-02-02 | Disposition: A | Discharge status: Home / Self Care | Payer: Medicaid Other | Source: Ambulatory Visit | Attending: Obstetrics and Gynecology | Admitting: Obstetrics and Gynecology

## 2015-02-02 LAB — DIFFERENTIAL
Basophils Absolute: 0 10*3/uL (ref 0–0.1)
Basophils Relative: 0 %
Eosinophils Absolute: 0.1 10*3/uL (ref 0–0.7)
Eosinophils Relative: 1 %
Lymphocytes Relative: 18 %
Lymphs Abs: 1.7 10*3/uL (ref 1.0–3.6)
MONOS PCT: 6 %
Monocytes Absolute: 0.6 10*3/uL (ref 0.2–0.9)
Neutro Abs: 7.4 10*3/uL — ABNORMAL HIGH (ref 1.4–6.5)
Neutrophils Relative %: 75 %

## 2015-02-02 LAB — CBC
HEMATOCRIT: 36 % (ref 35.0–47.0)
HEMOGLOBIN: 12.4 g/dL (ref 12.0–16.0)
MCH: 30.3 pg (ref 26.0–34.0)
MCHC: 34.4 g/dL (ref 32.0–36.0)
MCV: 88.1 fL (ref 80.0–100.0)
Platelets: 146 10*3/uL — ABNORMAL LOW (ref 150–440)
RBC: 4.08 MIL/uL (ref 3.80–5.20)
RDW: 14.1 % (ref 11.5–14.5)
WBC: 9.9 10*3/uL (ref 3.6–11.0)

## 2015-02-02 LAB — BASIC METABOLIC PANEL
Anion gap: 8 (ref 5–15)
BUN: 10 mg/dL (ref 6–20)
CO2: 20 mmol/L — ABNORMAL LOW (ref 22–32)
Calcium: 8.9 mg/dL (ref 8.9–10.3)
Chloride: 107 mmol/L (ref 101–111)
Creatinine, Ser: 0.7 mg/dL (ref 0.44–1.00)
GFR calc non Af Amer: >60 mL/min (ref >60)
GLUCOSE: 182 mg/dL — AB (ref 65–99)
POTASSIUM: 3.8 mmol/L (ref 3.5–5.1)
Sodium: 135 mmol/L (ref 135–145)

## 2015-02-02 LAB — TYPE AND SCREEN
ABO/RH(D): O POS
Antibody Screen: NEGATIVE

## 2015-02-02 NOTE — Plan of Care (Signed)
Victoria Cortez   Your procedure is scheduled on: 02/03/15   Report to [facility] at 0530am.             [facility]             [address]             [place], [place] [address]  Call this number if you have problems the morning of surgery: [phone]   Remember:   Do not eat food or drink liquids after midnight.     Do not wear jewelry, make-up or nail polish.  Do not wear lotions, powders, or perfumes. You may wear deodorant.  Do not shave prior to surgery.  Do not bring valuables to the hospital.  [facility] is not responsible for any            belongings or valuables.                Contacts, dentures or bridgework may not be worn into surgery.  Leave suitcase in the car. After surgery it may be brought to your room.  For patients admitted to the hospital, discharge time is determined by your             treatment team.               Special Instructions: Preparing the skin before Cesarean Section              To help prevent the risk of infection at your surgical site, we are providing             you with rinse-free Sage 2% Chlorhexidine Gluconate (HCG) disposable             wipes.               The night before surgery:              1. Shower or bathe with warm water             2. Do not apply lotion or perfume             3. Wait one hour after shower, skin should be dry and cool             4. Open Sage wipe package - 2 disposable cloths are inside             5. Wipe the lower abdomen from the pubic line to the navel and hip bone to hip             bone with one cloth             6. Use the second cloth to wipe the front of the upper thighs             7. Allow the area to dry for one minute. DO NOT RINSE             8. Skin may feel "tacky" for several minutes             9. Dress in freshly laundered, clean clothes           10. Do not shower the morning of surgery    Please read over the following fact sheets  that you were given: Coughing and            Deep Breathing and  Surgical Site Infection Prevention

## 2015-02-03 ENCOUNTER — Encounter
Admission: EM | Disposition: A | Discharge status: Home / Self Care | Payer: Self-pay | Source: Home / Self Care | Attending: Obstetrics and Gynecology

## 2015-02-03 ENCOUNTER — Inpatient Hospital Stay: Payer: Medicaid Other | Admitting: Anesthesiology

## 2015-02-03 ENCOUNTER — Encounter: Payer: Self-pay | Admitting: *Deleted

## 2015-02-03 ENCOUNTER — Inpatient Hospital Stay
Admission: EM | Admit: 2015-02-03 | Discharge: 2015-02-07 | DRG: 766 | Disposition: A | Discharge status: Home / Self Care | Payer: Medicaid Other | Attending: Obstetrics and Gynecology | Admitting: Obstetrics and Gynecology

## 2015-02-03 ENCOUNTER — Inpatient Hospital Stay
Admission: RE | Admit: 2015-02-03 | Payer: Medicaid Other | Source: Ambulatory Visit | Admitting: Obstetrics and Gynecology

## 2015-02-03 DIAGNOSIS — Z794 Long term (current) use of insulin: Secondary | ICD-10-CM | POA: Diagnosis not present

## 2015-02-03 DIAGNOSIS — Z98891 History of uterine scar from previous surgery: Secondary | ICD-10-CM

## 2015-02-03 DIAGNOSIS — O321XX0 Maternal care for breech presentation, not applicable or unspecified: Secondary | ICD-10-CM | POA: Diagnosis present

## 2015-02-03 DIAGNOSIS — K660 Peritoneal adhesions (postprocedural) (postinfection): Secondary | ICD-10-CM | POA: Diagnosis present

## 2015-02-03 DIAGNOSIS — O24429 Gestational diabetes mellitus in childbirth, unspecified control: Principal | ICD-10-CM | POA: Diagnosis present

## 2015-02-03 DIAGNOSIS — O3421 Maternal care for scar from previous cesarean delivery: Secondary | ICD-10-CM | POA: Diagnosis present

## 2015-02-03 DIAGNOSIS — O2492 Unspecified diabetes mellitus in childbirth: Secondary | ICD-10-CM | POA: Diagnosis present

## 2015-02-03 DIAGNOSIS — Z302 Encounter for sterilization: Secondary | ICD-10-CM | POA: Diagnosis not present

## 2015-02-03 DIAGNOSIS — Z3A38 38 weeks gestation of pregnancy: Secondary | ICD-10-CM | POA: Diagnosis present

## 2015-02-03 LAB — GLUCOSE, CAPILLARY: Glucose-Capillary: 177 mg/dL — ABNORMAL HIGH (ref 65–99)

## 2015-02-03 LAB — HIV ANTIBODY (ROUTINE TESTING W REFLEX): HIV SCREEN 4TH GENERATION: NONREACTIVE

## 2015-02-03 LAB — RPR: RPR Ser Ql: NONREACTIVE

## 2015-02-03 SURGERY — Surgical Case
Anesthesia: Spinal | Wound class: Clean Contaminated

## 2015-02-03 MED ORDER — DIBUCAINE 1 % RE OINT: 1.0000 "application " | TOPICAL_OINTMENT | RECTAL | Status: DC | PRN

## 2015-02-03 MED ORDER — LACTATED RINGERS IV BOLUS (SEPSIS)
500.0000 mL | Freq: Once | INTRAVENOUS | Status: AC
  Administered 2015-02-03: 500 mL via INTRAVENOUS

## 2015-02-03 MED ORDER — CEFAZOLIN SODIUM-DEXTROSE 2-3 GM-% IV SOLR
2.0000 g | INTRAVENOUS | Status: AC
  Administered 2015-02-03: 2 g via INTRAVENOUS

## 2015-02-03 MED ORDER — KETOROLAC TROMETHAMINE 30 MG/ML IJ SOLN
INTRAMUSCULAR | Status: AC
  Administered 2015-02-03: 30 mg via INTRAVENOUS
  Filled 2015-02-03: qty 1

## 2015-02-03 MED ORDER — TETANUS-DIPHTH-ACELL PERTUSSIS 5-2.5-18.5 LF-MCG/0.5 IM SUSP: 0.5000 mL | Freq: Once | INTRAMUSCULAR | Status: DC

## 2015-02-03 MED ORDER — BISACODYL 10 MG RE SUPP: 10.0000 mg | Freq: Every day | RECTAL | Status: DC | PRN

## 2015-02-03 MED ORDER — BUPIVACAINE 0.25 % ON-Q PUMP DUAL CATH 400 ML: 400.0000 mL | INJECTION | Status: DC

## 2015-02-03 MED ORDER — MENTHOL 3 MG MT LOZG
1.0000 | LOZENGE | OROMUCOSAL | Status: DC | PRN
  Filled 2015-02-03: qty 9

## 2015-02-03 MED ORDER — BUPIVACAINE HCL (PF) 0.5 % IJ SOLN: 10.0000 mL | Freq: Once | INTRAMUSCULAR | Status: DC

## 2015-02-03 MED ORDER — DIPHENHYDRAMINE HCL 50 MG/ML IJ SOLN
12.5000 mg | INTRAMUSCULAR | Status: DC | PRN
  Administered 2015-02-03: 12.5 mg via INTRAVENOUS

## 2015-02-03 MED ORDER — LACTATED RINGERS IV SOLN
INTRAVENOUS | Status: DC
  Administered 2015-02-03 (×3): via INTRAVENOUS

## 2015-02-03 MED ORDER — SIMETHICONE 80 MG PO CHEW
80.0000 mg | CHEWABLE_TABLET | Freq: Three times a day (TID) | ORAL | Status: DC
  Administered 2015-02-05 – 2015-02-07 (×6): 80 mg via ORAL
  Filled 2015-02-03 (×8): qty 1

## 2015-02-03 MED ORDER — SIMETHICONE 80 MG PO CHEW: 80.0000 mg | CHEWABLE_TABLET | ORAL | Status: DC

## 2015-02-03 MED ORDER — NALOXONE HCL 1 MG/ML IJ SOLN: 1.0000 ug/kg/h | INTRAVENOUS | Status: DC | PRN

## 2015-02-03 MED ORDER — LACTATED RINGERS IV SOLN
Freq: Once | INTRAVENOUS | Status: AC
  Administered 2015-02-03: 06:00:00 via INTRAVENOUS

## 2015-02-03 MED ORDER — BUPIVACAINE HCL (PF) 0.5 % IJ SOLN
INTRAMUSCULAR | Status: AC
  Filled 2015-02-03: qty 30

## 2015-02-03 MED ORDER — SIMETHICONE 80 MG PO CHEW
80.0000 mg | CHEWABLE_TABLET | ORAL | Status: DC | PRN
  Administered 2015-02-04 (×2): 80 mg via ORAL
  Filled 2015-02-03: qty 1

## 2015-02-03 MED ORDER — MORPHINE SULFATE (PF) 0.5 MG/ML IJ SOLN
INTRAMUSCULAR | Status: DC | PRN
  Administered 2015-02-03: 2 mg via EPIDURAL
  Administered 2015-02-03: .1 mg via EPIDURAL

## 2015-02-03 MED ORDER — OXYTOCIN 40 UNITS IN LACTATED RINGERS INFUSION - SIMPLE MED: 62.5000 mL/h | INTRAVENOUS | Status: DC

## 2015-02-03 MED ORDER — FENTANYL CITRATE (PF) 100 MCG/2ML IJ SOLN
INTRAMUSCULAR | Status: DC | PRN
Start: 2015-02-03 — End: 2015-02-03
  Administered 2015-02-03: 10 ug via INTRAVENOUS

## 2015-02-03 MED ORDER — NALBUPHINE HCL 10 MG/ML IJ SOLN
5.0000 mg | Freq: Once | INTRAMUSCULAR | Status: AC | PRN
  Filled 2015-02-03: qty 0.5

## 2015-02-03 MED ORDER — NALOXONE HCL 0.4 MG/ML IJ SOLN: 0.4000 mg | INTRAMUSCULAR | Status: DC | PRN

## 2015-02-03 MED ORDER — PRENATAL MULTIVITAMIN CH
1.0000 | ORAL_TABLET | Freq: Every day | ORAL | Status: DC
  Administered 2015-02-04 – 2015-02-07 (×3): 1 via ORAL
  Filled 2015-02-03 (×3): qty 1

## 2015-02-03 MED ORDER — FLEET ENEMA 7-19 GM/118ML RE ENEM: 1.0000 | ENEMA | Freq: Every day | RECTAL | Status: DC | PRN

## 2015-02-03 MED ORDER — SENNOSIDES-DOCUSATE SODIUM 8.6-50 MG PO TABS
2.0000 | ORAL_TABLET | ORAL | Status: DC
  Administered 2015-02-04 – 2015-02-05 (×2): 2 via ORAL
  Filled 2015-02-03 (×3): qty 2

## 2015-02-03 MED ORDER — DEXTROSE IN LACTATED RINGERS 5 % IV SOLN: INTRAVENOUS | Status: DC

## 2015-02-03 MED ORDER — SODIUM CHLORIDE 0.9 % IV SOLN: INTRAVENOUS | Status: DC

## 2015-02-03 MED ORDER — DIPHENHYDRAMINE HCL 50 MG/ML IJ SOLN
INTRAMUSCULAR | Status: AC
  Administered 2015-02-03: 12.5 mg via INTRAVENOUS
  Filled 2015-02-03: qty 1

## 2015-02-03 MED ORDER — SIMETHICONE 80 MG PO CHEW
80.0000 mg | CHEWABLE_TABLET | ORAL | Status: DC
  Administered 2015-02-04 – 2015-02-06 (×3): 80 mg via ORAL
  Filled 2015-02-03 (×3): qty 1

## 2015-02-03 MED ORDER — IBUPROFEN 600 MG PO TABS
600.0000 mg | ORAL_TABLET | Freq: Four times a day (QID) | ORAL | Status: DC
  Administered 2015-02-04 – 2015-02-07 (×13): 600 mg via ORAL
  Filled 2015-02-03 (×14): qty 1

## 2015-02-03 MED ORDER — LACTATED RINGERS IV SOLN
INTRAVENOUS | Status: DC
  Administered 2015-02-03 – 2015-02-04 (×2): via INTRAVENOUS

## 2015-02-03 MED ORDER — SODIUM CHLORIDE 0.9 % IJ SOLN: 3.0000 mL | INTRAMUSCULAR | Status: DC | PRN

## 2015-02-03 MED ORDER — WITCH HAZEL-GLYCERIN EX PADS: 1.0000 "application " | MEDICATED_PAD | CUTANEOUS | Status: DC | PRN

## 2015-02-03 MED ORDER — NALBUPHINE HCL 10 MG/ML IJ SOLN
5.0000 mg | INTRAMUSCULAR | Status: DC | PRN
  Filled 2015-02-03: qty 0.5

## 2015-02-03 MED ORDER — ACETAMINOPHEN 325 MG PO TABS: 650.0000 mg | ORAL_TABLET | ORAL | Status: DC | PRN

## 2015-02-03 MED ORDER — IBUPROFEN 600 MG PO TABS: 600.0000 mg | ORAL_TABLET | Freq: Four times a day (QID) | ORAL | Status: DC

## 2015-02-03 MED ORDER — LANOLIN HYDROUS EX OINT: 1.0000 "application " | TOPICAL_OINTMENT | CUTANEOUS | Status: DC | PRN

## 2015-02-03 MED ORDER — DIPHENHYDRAMINE HCL 25 MG PO CAPS
25.0000 mg | ORAL_CAPSULE | Freq: Four times a day (QID) | ORAL | Status: DC | PRN
  Administered 2015-02-03: 25 mg via ORAL
  Filled 2015-02-03: qty 1

## 2015-02-03 MED ORDER — EPHEDRINE SULFATE 50 MG/ML IJ SOLN
INTRAMUSCULAR | Status: DC | PRN
  Administered 2015-02-03 (×3): 5 mg via INTRAVENOUS

## 2015-02-03 MED ORDER — CEFAZOLIN SODIUM-DEXTROSE 2-3 GM-% IV SOLR
INTRAVENOUS | Status: AC
  Administered 2015-02-03: 2 g via INTRAVENOUS
  Filled 2015-02-03: qty 50

## 2015-02-03 MED ORDER — MEASLES, MUMPS & RUBELLA VAC ~~LOC~~ INJ: 0.5000 mL | INJECTION | Freq: Once | SUBCUTANEOUS | Status: DC

## 2015-02-03 MED ORDER — PRENATAL MULTIVITAMIN CH: 1.0000 | ORAL_TABLET | Freq: Every day | ORAL | Status: DC

## 2015-02-03 MED ORDER — LANOLIN HYDROUS EX OINT
1.0000 | TOPICAL_OINTMENT | CUTANEOUS | Status: DC | PRN
Start: 2015-02-03 — End: 2015-02-07

## 2015-02-03 MED ORDER — LACTATED RINGERS IV SOLN: INTRAVENOUS | Status: DC

## 2015-02-03 MED ORDER — KETOROLAC TROMETHAMINE 30 MG/ML IJ SOLN: 30.0000 mg | Freq: Four times a day (QID) | INTRAMUSCULAR | Status: DC | PRN

## 2015-02-03 MED ORDER — SIMETHICONE 80 MG PO CHEW: 80.0000 mg | CHEWABLE_TABLET | Freq: Three times a day (TID) | ORAL | Status: DC

## 2015-02-03 MED ORDER — OXYCODONE-ACETAMINOPHEN 5-325 MG PO TABS: 1.0000 | ORAL_TABLET | ORAL | Status: DC | PRN

## 2015-02-03 MED ORDER — CITRIC ACID-SODIUM CITRATE 334-500 MG/5ML PO SOLN
ORAL | Status: AC
  Administered 2015-02-03: 30 mL
  Filled 2015-02-03: qty 15

## 2015-02-03 MED ORDER — OXYCODONE-ACETAMINOPHEN 5-325 MG PO TABS: 2.0000 | ORAL_TABLET | ORAL | Status: DC | PRN

## 2015-02-03 MED ORDER — DIPHENHYDRAMINE HCL 50 MG/ML IJ SOLN
12.5000 mg | INTRAMUSCULAR | Status: DC | PRN
  Administered 2015-02-03: 12.5 mg via INTRAVENOUS
  Filled 2015-02-03: qty 1

## 2015-02-03 MED ORDER — SENNOSIDES-DOCUSATE SODIUM 8.6-50 MG PO TABS: 2.0000 | ORAL_TABLET | ORAL | Status: DC

## 2015-02-03 MED ORDER — SIMETHICONE 80 MG PO CHEW: 80.0000 mg | CHEWABLE_TABLET | ORAL | Status: DC | PRN

## 2015-02-03 MED ORDER — OXYTOCIN 40 UNITS IN LACTATED RINGERS INFUSION - SIMPLE MED
INTRAVENOUS | Status: AC
Start: 2015-02-03 — End: 2015-02-03
  Administered 2015-02-03: 1000 mL via INTRAVENOUS
  Administered 2015-02-03: 150 mL via INTRAVENOUS
  Filled 2015-02-03: qty 1000

## 2015-02-03 MED ORDER — KETOROLAC TROMETHAMINE 30 MG/ML IJ SOLN
30.0000 mg | Freq: Four times a day (QID) | INTRAMUSCULAR | Status: DC | PRN
  Administered 2015-02-03 – 2015-02-04 (×3): 30 mg via INTRAVENOUS
  Filled 2015-02-03 (×2): qty 1

## 2015-02-03 MED ORDER — DIPHENHYDRAMINE HCL 25 MG PO CAPS: 25.0000 mg | ORAL_CAPSULE | ORAL | Status: DC | PRN

## 2015-02-03 MED ORDER — ONDANSETRON HCL 4 MG/2ML IJ SOLN
INTRAMUSCULAR | Status: DC | PRN
  Administered 2015-02-03 (×2): 4 mg via INTRAVENOUS

## 2015-02-03 MED ORDER — BUPIVACAINE HCL 0.5 % IJ SOLN
INTRAMUSCULAR | Status: DC | PRN
  Administered 2015-02-03: 10 mL

## 2015-02-03 MED ORDER — ONDANSETRON HCL 4 MG/2ML IJ SOLN: 4.0000 mg | Freq: Three times a day (TID) | INTRAMUSCULAR | Status: DC | PRN

## 2015-02-03 MED ORDER — OXYCODONE-ACETAMINOPHEN 5-325 MG PO TABS
2.0000 | ORAL_TABLET | ORAL | Status: DC | PRN
  Administered 2015-02-04 (×2): 2 via ORAL
  Filled 2015-02-03 (×2): qty 2

## 2015-02-03 MED ORDER — MENTHOL 3 MG MT LOZG: 1.0000 | LOZENGE | OROMUCOSAL | Status: DC | PRN

## 2015-02-03 SURGICAL SUPPLY — 25 items
BARRIER ADHS 3X4 INTERCEED (GAUZE/BANDAGES/DRESSINGS) ×2 IMPLANT
CANISTER SUCT 3000ML (MISCELLANEOUS) ×2 IMPLANT
CATH KIT ON-Q SILVERSOAK 5IN (CATHETERS) ×4 IMPLANT
CHLORAPREP W/TINT 26ML (MISCELLANEOUS) ×2 IMPLANT
DRSG TELFA 3X8 NADH (GAUZE/BANDAGES/DRESSINGS) ×2 IMPLANT
GAUZE SPONGE 4X4 12PLY STRL (GAUZE/BANDAGES/DRESSINGS) ×2 IMPLANT
GOWN STRL REUS W/ TWL LRG LVL3 (GOWN DISPOSABLE) ×3 IMPLANT
GOWN STRL REUS W/TWL LRG LVL3 (GOWN DISPOSABLE) ×3
HEMOSTAT ARISTA ABSORB 3G PWDR (MISCELLANEOUS) ×2 IMPLANT
NS IRRIG 1000ML POUR BTL (IV SOLUTION) ×2 IMPLANT
PAD GROUND ADULT SPLIT (MISCELLANEOUS) ×2 IMPLANT
PAD OB MATERNITY 4.3X12.25 (PERSONAL CARE ITEMS) ×2 IMPLANT
PAD PREP 24X41 OB/GYN DISP (PERSONAL CARE ITEMS) ×2 IMPLANT
SPONGE LAP 18X18 5 PK (GAUZE/BANDAGES/DRESSINGS) ×4 IMPLANT
STAPLER INSORB 30 2030 C-SECTI (MISCELLANEOUS) ×2 IMPLANT
SUT MNCRL 4-0 (SUTURE) ×1
SUT MNCRL 4-0 27XMFL (SUTURE) ×1
SUT PLAIN GUT 2-0 30 C14 SG823 (SUTURE) ×2
SUT VIC AB 0 CT1 36 (SUTURE) ×20 IMPLANT
SUT VIC AB 0 CTX 36 (SUTURE) ×2
SUT VIC AB 0 CTX36XBRD ANBCTRL (SUTURE) ×2 IMPLANT
SUT VIC AB 3-0 SH 27 (SUTURE) ×2
SUT VIC AB 3-0 SH 27X BRD (SUTURE) ×2 IMPLANT
SUTURE MNCRL 4-0 27XMF (SUTURE) ×1 IMPLANT
SUTURE PLN GUT2-0 30 C14 SG823 (SUTURE) ×1 IMPLANT

## 2015-02-03 NOTE — Anesthesia Preprocedure Evaluation (Signed)
Anesthesia Evaluation  Patient identified by MRN, date of birth, ID band Patient awake    Reviewed: Allergy & Precautions, H&P , NPO status , Patient's Chart, lab work & pertinent test results, reviewed documented beta blocker date and time   Airway Mallampati: III  TM Distance: >3 FB Neck ROM: full    Dental no notable dental hx. (+) Teeth Intact   Pulmonary neg shortness of breath, neg sleep apnea, neg COPDneg recent URI, Current Smoker,  breath sounds clear to auscultation  Pulmonary exam normal       Cardiovascular Exercise Tolerance: Good negative cardio ROS Normal cardiovascular examRhythm:regular Rate:Normal     Neuro/Psych negative neurological ROS  negative psych ROS   GI/Hepatic negative GI ROS, Neg liver ROS,   Endo/Other  diabetes, Poorly Controlled, Gestational, Insulin DependentMorbid obesity  Renal/GU negative Renal ROS  negative genitourinary   Musculoskeletal   Abdominal   Peds  Hematology  (+) Blood dyscrasia, Sickle cell trait ,   Anesthesia Other Findings Past Medical History:   Diabetes mellitus without complication                       Sickle cell trait                                            Reproductive/Obstetrics negative OB ROS                             Anesthesia Physical Anesthesia Plan  ASA: III  Anesthesia Plan: General   Post-op Pain Management:    Induction:   Airway Management Planned:   Additional Equipment:   Intra-op Plan:   Post-operative Plan:   Informed Consent: I have reviewed the patients History and Physical, chart, labs and discussed the procedure including the risks, benefits and alternatives for the proposed anesthesia with the patient or authorized representative who has indicated his/her understanding and acceptance.   Dental Advisory Given  Plan Discussed with: Anesthesiologist, CRNA and Surgeon  Anesthesia Plan  Comments:         Anesthesia Quick Evaluation

## 2015-02-03 NOTE — Transfer of Care (Signed)
Immediate Anesthesia Transfer of Care Note  Patient: Victoria Cortez  Procedure(s) Performed: Procedure(s): CESAREAN SECTION WITH BILATERAL TUBAL LIGATION (N/A)  Patient Location: LD  Anesthesia Type:Spinal  Level of Consciousness: awake, alert , oriented and patient cooperative  Airway & Oxygen Therapy: Patient Spontanous Breathing  Post-op Assessment: Report given to RN, Post -op Vital signs reviewed and stable and Patient moving all extremities X 4  Post vital signs: Reviewed and stable  Last Vitals:  Filed Vitals:   02/03/15 1025  BP: 119/60  Pulse: 67  Temp: 36.6 C  Resp:     Complications: No apparent anesthesia complications

## 2015-02-03 NOTE — Anesthesia Postprocedure Evaluation (Signed)
  Anesthesia Post-op Note  Patient: Victoria Cortez  Procedure(s) Performed: Procedure(s): CESAREAN SECTION WITH BILATERAL TUBAL LIGATION (N/A)  Anesthesia type:Spinal  Patient location: PACU  Post pain: Pain level controlled  Post assessment: Post-op Vital signs reviewed, Patient's Cardiovascular Status Stable, Respiratory Function Stable, Patent Airway and No signs of Nausea or vomiting  Post vital signs: Reviewed and stable  Last Vitals:  Filed Vitals:   02/03/15 1125  BP: 103/51  Pulse: 69  Temp:   Resp:     Level of consciousness: awake, alert  and patient cooperative  Complications: No apparent anesthesia complications.  Patient able to move legs, no residual numbness/tingling.

## 2015-02-03 NOTE — Op Note (Signed)
Cesarean Section Procedure Note  Indications: previous uterine incision kerr x2  Pre-operative Diagnosis:  1. Intrauterine pregnancy at [redacted]w[redacted]d;  2. Desires permanent sterilization 3. Prior C/S x2 4. Uncontrolled gestational diabetes mellitus A2  Post-operative Diagnosis: same, delivered.  Procedure: 1. Low Transverse Cesarean Section through Pfannenstiel incision  2. Bilateral tubal sterilization using modified Parkland method 3. Lysis of adhesions for >50% of the case  Surgeon: Christeen Douglas, MD  Assistant(s):  Surgical tech- Duwaine Maxin CST  Anesthesia: Spinal anesthesia  Estimated Blood Loss:  900         Drains: On-Q pump         Total IV Fluids:  Urine Output: clear urine         Specimens: portion of right and left Fallopian tubes         Complications:  Lysis of adhesions; patient tolerated the procedure well.         Disposition: PACU - hemodynamically stable.         Condition: stable  Findings:  A female infant "Kason" in breech presentation. Amniotic fluid - Meconium  Birth weight 3550 g.  Apgars of 8 and 9.   Intact placenta with a three-vessel cord.  Grossly normal uterus, tubes and ovaries bilaterally. Thick intraabdominal adhesions were noted - specifically, the omentum and rectus muscles were densely adherent to the anterior abdominal wall. These were transected and suture ligated. There was a uterine window in the lower uterine segment.    Procedure Details  The patient was taken to Operating Room, identified as the correct patient and the procedure verified as C-Section Delivery. A Time Out was held and the above information confirmed.  After induction of anesthesia, the patient was draped and prepped in the usual sterile manner. A Pfannenstiel incision was made and carried down through the subcutaneous tissue to the fascia. Fascial incision was made and extended transversely with the Mayo scissors. The fascia was separated from the  underlying rectus tissue superiorly and inferiorly suing the knife, as the tissue planes were obliterated. The rectus and omental adhesions were doubly clamped and doubly tied.The peritoneum was identified and entered bluntly. Peritoneal incision was extended longitudinally. The utero-vesical peritoneal reflection was unable to be identified  A low transverse hysterotomy was made. The fetus was delivered in a breech position using standard breech maneuvers. The delivery was atraumatic and no undue pressure was placed on the baby. The umbilical cord was clamped x2 and cut and the infant was handed to the awaiting pediatricians. The placenta was removed intact and appeared normal with a 3-vessel cord.   The uterus was exteriorized and cleared of all clot and debris. The hysterotomy was closed with running sutures of  0-Vicryl. A second imbricating layer was placed with the same suture to assist in hemostasis. Pressure was placed on the incision while attention was then turned to the tubal ligation.   The left fallopian tube distinguished from the round ligament by identifying the fimbria and was grasped with a Babcock clamp in the midisthmic portion approximately 3 cm from the cornual region. It was then doubly ligated with 0-plain gut suture in a Parkland fashion. The tubal segment was excised with Metzenbaum scissors. Tubal ostea noted. The procedure was repeated on the right side. *Care was noted to examine both tubal sites in situ to ensure the sutures were intact and no bleeding was noted. The uterus was returned to the abdomen.  The pelvis was irrigated and moderate hemostasis was noted.  Multiple figure of 8 sutures and Bovie cautery was used to assist in hemostasis. Surgicel powder was placed, and and an adhesion barrier was placed over the hysterotomy. The On-Q pump was placed without extra bleeding, and with care to avoid the omentum.The fascia was then reapproximated with running sutures of 0 Vicryl.  The subcutaneous tissue was reapproximated with interrupted sutures of 0-Vicryl. The skin was reapproximated with Insorb absorbable staples. A pressure dressing was placed.  Instrument, sponge, and needle counts were correct prior to the abdominal closure and at the conclusion of the case.   The patient tolerated the procedure well and was transferred to the recovery room in stable condition.   Christeen Douglas, MD8/10/2014

## 2015-02-03 NOTE — H&P (Addendum)
H&P Update  At time of admission, complete history and physical was performed.  There are no changes to this H&P at this time.  Surgical plan for Cesarean section confirmed on the consent. The patient understands the potential benefits and risks and the consents have been signed and placed on the chart.    Pt with uncontrolled diabetes in pregnancy, on 110u insulin daily. POC blood glucose today is 177- pt has been NPO since last night, when she did take her usual dose of regular and NPH. Fetal growth and AFI have been within normal limits- last EFW 45%. Pt aware of possibility for neonatal hypoglycemia and need for monitoring in special care nursery. Postpartum plan to monitor blood sugars and treat as appropriate. We will screen for diabetes or diagnose as clinically indicated.   Preoperative Checklist  T&S: on chart Body mass index is 39.45 kg/(m^2). Significant past surgeries: C/S x2 Marking required: no  Christeen Douglas, MD 02/03/2015 7:39 AM

## 2015-02-03 NOTE — Anesthesia Procedure Notes (Signed)
Spinal Patient location during procedure: OR Start time: 02/03/2015 7:50 AM End time: 02/03/2015 8:07 AM Staffing Anesthesiologist: Lenard Simmer Resident/CRNA: Delaynie Stetzer, Rosanne Sack Performed by: anesthesiologist  Preanesthetic Checklist Completed: patient identified, site marked, surgical consent, pre-op evaluation, timeout performed, IV checked, risks and benefits discussed and monitors and equipment checked Spinal Block Patient position: sitting Prep: ChloraPrep Patient monitoring: heart rate, cardiac monitor, continuous pulse ox and blood pressure Approach: midline Location: L3-4 Injection technique: single-shot Needle Needle type: Whitacre  Needle gauge: 27 G Needle length: 12.7 cm

## 2015-02-04 LAB — POCT CBG (FASTING - GLUCOSE)-MANUAL ENTRY
Glucose, fingerstick: 102 mg/dL — AB (ref 70–99)
Glucose, fingerstick: 166 mg/dL — AB (ref 70–99)

## 2015-02-04 LAB — CBC
HEMATOCRIT: 29.3 % — AB (ref 35.0–47.0)
Hemoglobin: 10 g/dL — ABNORMAL LOW (ref 12.0–16.0)
MCH: 30.1 pg (ref 26.0–34.0)
MCHC: 34.1 g/dL (ref 32.0–36.0)
MCV: 88.2 fL (ref 80.0–100.0)
Platelets: 130 10*3/uL — ABNORMAL LOW (ref 150–440)
RBC: 3.32 MIL/uL — ABNORMAL LOW (ref 3.80–5.20)
RDW: 14 % (ref 11.5–14.5)
WBC: 8.6 10*3/uL (ref 3.6–11.0)

## 2015-02-04 LAB — GLUCOSE, CAPILLARY: GLUCOSE-CAPILLARY: 172 mg/dL — AB (ref 65–99)

## 2015-02-04 MED ORDER — INSULIN ASPART 100 UNIT/ML ~~LOC~~ SOLN
0.0000 [IU] | Freq: Three times a day (TID) | SUBCUTANEOUS | Status: DC
  Administered 2015-02-04: 3 [IU] via SUBCUTANEOUS
  Administered 2015-02-04: 6 [IU] via SUBCUTANEOUS
  Administered 2015-02-05: 4 [IU] via SUBCUTANEOUS
  Filled 2015-02-04: qty 4
  Filled 2015-02-04: qty 3
  Filled 2015-02-04: qty 6

## 2015-02-04 NOTE — Progress Notes (Signed)
Called to room by Pt. Stating she wanted her NBS checked because, she felt her Blood Sugar was low. NBS assessed and was 172. Pt. Is alert and oriented with aprop. Affect. She c/o right Abd. And shoulder pain. Pt. Abd. Is round and soft with hypoactive bowel sounds. Fills sl. Gas filled. Pt. States she has not walked today except to get up to bathroom. Assisted Pt. Up and walked 3 Laps around Nurses station in hall. Tolerated well. She states she passed gas yesterday but none today. Dr. Dalbert Garnet notified of all the above and order for A.M. CBC requested and placed in computer. Will cont. To follow Pt. Closely.

## 2015-02-04 NOTE — Progress Notes (Signed)
Sitting up on side of bed,Smiling. States she feels,"Better". Abd. Conts. Round and soft. Has not passed gas as yet. Instructed Pt. To ambulate one more time in [place] before sleep and she V/O.

## 2015-02-04 NOTE — Progress Notes (Signed)
Subjective: Postpartum Day 1: Cesarean Delivery Patient reports incisional pain.    Objective: Vital signs in last 24 hours: Temp:  [97.8 F (36.6 C)-99 F (37.2 C)] 98.1 F (36.7 C) (08/06 0957) Pulse Rate:  [59-89] 84 (08/06 0957) Resp:  [14-20] 18 (08/06 0446) BP: (92-123)/(38-66) 123/65 mmHg (08/06 0957) SpO2:  [98 %-100 %] 99 % (08/06 0957)  Physical Exam:  General: alert, cooperative and mild distress Lochia: appropriate Uterine Fundus: firm Incision: healing well, slight clear drainage present, appropriate pain DVT Evaluation: No evidence of DVT seen on physical exam. Negative Homan's sign.   Recent Labs  02/02/15 0936 02/04/15 0535  HGB 12.4 10.0*  HCT 36.0 29.3*    Assessment/Plan: Status post Cesarean section. Postoperative course complicated by uncontrolled sugars. 2hr postprandials 200. Start sliding scale today. Will monitor. If still elevated over weekend, will have diabetic educator return. Plan for endocrine consult as outpatient.  Continue current care. Pressure dressing if drainage from incision.  Christeen Douglas 02/04/2015, 10:24 AM

## 2015-02-05 LAB — CBC WITH DIFFERENTIAL/PLATELET
Basophils Absolute: 0 10*3/uL (ref 0–0.1)
Basophils Relative: 0 %
Eosinophils Absolute: 0.1 10*3/uL (ref 0–0.7)
Eosinophils Relative: 1 %
HCT: 28.6 % — ABNORMAL LOW (ref 35.0–47.0)
HEMOGLOBIN: 10.2 g/dL — AB (ref 12.0–16.0)
LYMPHS ABS: 1.3 10*3/uL (ref 1.0–3.6)
LYMPHS PCT: 13 %
MCH: 31.1 pg (ref 26.0–34.0)
MCHC: 35.7 g/dL (ref 32.0–36.0)
MCV: 87.1 fL (ref 80.0–100.0)
MONO ABS: 0.6 10*3/uL (ref 0.2–0.9)
MONOS PCT: 6 %
NEUTROS PCT: 80 %
Neutro Abs: 8.3 10*3/uL — ABNORMAL HIGH (ref 1.4–6.5)
PLATELETS: 153 10*3/uL (ref 150–440)
RBC: 3.28 MIL/uL — AB (ref 3.80–5.20)
RDW: 14.1 % (ref 11.5–14.5)
WBC: 10.4 10*3/uL (ref 3.6–11.0)

## 2015-02-05 LAB — GLUCOSE, CAPILLARY
Glucose-Capillary: 115 mg/dL — ABNORMAL HIGH (ref 65–99)
Glucose-Capillary: 134 mg/dL — ABNORMAL HIGH (ref 65–99)
Glucose-Capillary: 169 mg/dL — ABNORMAL HIGH (ref 65–99)
Glucose-Capillary: 177 mg/dL — ABNORMAL HIGH (ref 65–99)

## 2015-02-05 MED ORDER — INSULIN ASPART 100 UNIT/ML ~~LOC~~ SOLN
0.0000 [IU] | Freq: Three times a day (TID) | SUBCUTANEOUS | Status: DC
  Administered 2015-02-05 – 2015-02-06 (×3): 4 [IU] via SUBCUTANEOUS
  Administered 2015-02-06: 8 [IU] via SUBCUTANEOUS
  Administered 2015-02-06: 4 [IU] via SUBCUTANEOUS
  Administered 2015-02-07: 5 [IU] via SUBCUTANEOUS
  Administered 2015-02-07: 8 [IU] via SUBCUTANEOUS
  Filled 2015-02-05: qty 5
  Filled 2015-02-05 (×2): qty 4
  Filled 2015-02-05: qty 8
  Filled 2015-02-05 (×2): qty 4
  Filled 2015-02-05: qty 8

## 2015-02-05 MED ORDER — NICOTINE 14 MG/24HR TD PT24: 14.0000 mg | MEDICATED_PATCH | Freq: Every day | TRANSDERMAL | Status: DC

## 2015-02-05 NOTE — Progress Notes (Signed)
C/O feeling, "shakey". Alert and oriented with aprop. Affect. Color good, skin W&D. BBS clear. 40.9,81,19,147/82. NBS is 177. Dr. Dalbert Garnet notified and order for Nicotine Patch if Pt. Desires. Infant to NN with S. Wall RN so Mom can rest. Will cont. To follow closely. Abd. Is soft and appears unchanged from previous assessments.

## 2015-02-05 NOTE — Progress Notes (Signed)
Subjective: Postpartum Day 2: Cesarean Delivery and BTL Patient reports incisional pain, tolerating PO and + flatus.    Objective: Vital signs in last 24 hours: Temp:  [98 F (36.7 C)-99.7 F (37.6 C)] 98.9 F (37.2 C) (08/07 1156) Pulse Rate:  [77-91] 77 (08/07 1156) Resp:  [20] 20 (08/06 1903) BP: (102-126)/(59-74) 124/68 mmHg (08/07 1156) SpO2:  [97 %-100 %] 100 % (08/07 0027)  Physical Exam:  General: alert, cooperative, no distress and mild pain Lochia: appropriate Uterine Fundus: firm Incision: healing well, no significant drainage, no dehiscence, no significant erythema DVT Evaluation: No evidence of DVT seen on physical exam. Negative Homan's sign. Abd: Softly distended Breasts: Soft   Recent Labs  02/04/15 0535 02/05/15 0524  HGB 10.0* 10.2*  HCT 29.3* 28.6*    Assessment/Plan: Status post Cesarean section. Doing well postoperatively.  Continue current care. Because of the nature of her abdominal adhesions, abdominal pain in rectus muscles is likely. I rechecked her Hgb last night to monitor for intrabdominal bleeding, and it was stable. I see no evidence of bleeding today- just pain that seems appropriate.  Diabetes: elevates sugars persist even with sliding scale and being postpartum. Increase SSI levels. Continue BG check with meals and fasting. Will continue to monitor, and plan to send to endocrinology as outpatient.  Christeen Douglas 02/05/2015, 12:03 PM

## 2015-02-06 LAB — SURGICAL PATHOLOGY

## 2015-02-06 LAB — GLUCOSE, CAPILLARY
GLUCOSE-CAPILLARY: 172 mg/dL — AB (ref 65–99)
GLUCOSE-CAPILLARY: 179 mg/dL — AB (ref 65–99)
GLUCOSE-CAPILLARY: 110 mg/dL — AB (ref 65–99)
Glucose-Capillary: 111 mg/dL — ABNORMAL HIGH (ref 65–99)

## 2015-02-06 NOTE — Progress Notes (Signed)
Post Partum Day 3 s/p repeat c/s and BTL . Pregnancy complicated by uncontrolled GDM  Subjective: incisional pain , still requiring sliding scale insulin > received 4 units sliding x2 yesterday   Objective: Blood pressure 120/64, pulse 73, temperature 98.1 F (36.7 C), temperature source Oral, resp. rate 18, height 5' (1.524 m), weight 202 lb (91.627 kg), last menstrual period 05/12/2014, SpO2 100 %, unknown if currently breastfeeding.  Physical Exam:  General: alert and cooperative Lochia: appropriate Lungs CTA  CV RRR  Uterine Fundus: firm Incision: no significant drainage DVT Evaluation: No evidence of DVT seen on physical exam.   Recent Labs  02/04/15 0535 02/05/15 0524  HGB 10.0* 10.2*  HCT 29.3* 28.6*   Fasting glucose 179  Assessment/Plan: Plan for discharge tomorrow giving still requiring insulin  Will place her on NPH for d/c tomorrow based on on average insulin use for the last 2 days  Needs endocrinology consult as out pt   LOS: 3 days   Aleck Locklin 02/06/2015, 8:58 AM

## 2015-02-06 NOTE — Lactation Note (Signed)
This note was copied from the chart of Victoria Chalisa Perkin. Lactation Consultation Note  Patient Name: Victoria Cortez Date: 02/06/2015     Maternal Data  Mom pumping and bottlefeeding expressed breastmilk and formula, will be d/c'd tomorrow and will need pump for  Home use, is on [facility] notified by phone and faxed referral that pt will need an electric breast pump    Feeding    [facility] Score/Interventions                      Lactation Tools Discussed/Used     Consult Status      Dyann Kief 02/06/2015, 4:32 PM

## 2015-02-07 LAB — GLUCOSE, CAPILLARY
Glucose-Capillary: 204 mg/dL — ABNORMAL HIGH (ref 65–99)
Glucose-Capillary: 207 mg/dL — ABNORMAL HIGH (ref 65–99)
Glucose-Capillary: 166 mg/dL — ABNORMAL HIGH (ref 65–99)
Glucose-Capillary: 102 mg/dL — ABNORMAL HIGH (ref 65–99)
Glucose-Capillary: 118 mg/dL — ABNORMAL HIGH (ref 65–99)
Glucose-Capillary: 123 mg/dL — ABNORMAL HIGH (ref 65–99)
Glucose-Capillary: 177 mg/dL — ABNORMAL HIGH (ref 65–99)

## 2015-02-07 MED ORDER — PRENATAL MULTIVITAMIN CH: 1.0000 | ORAL_TABLET | Freq: Every day | ORAL | Status: DC

## 2015-02-07 MED ORDER — INSULIN STARTER KIT- PEN NEEDLES (ENGLISH)
1.0000 | Freq: Once | Status: DC
  Filled 2015-02-07: qty 1

## 2015-02-07 MED ORDER — ACETAMINOPHEN 325 MG PO TABS: 650.0000 mg | ORAL_TABLET | ORAL | Status: DC | PRN

## 2015-02-07 MED ORDER — OXYCODONE-ACETAMINOPHEN 5-325 MG PO TABS: 1.0000 | ORAL_TABLET | ORAL | Status: DC | PRN

## 2015-02-07 MED ORDER — INJECTION DEVICE FOR INSULIN DEVI: Freq: Once | Status: DC

## 2015-02-07 MED ORDER — IBUPROFEN 600 MG PO TABS: 600.0000 mg | ORAL_TABLET | Freq: Four times a day (QID) | ORAL | Status: DC

## 2015-02-07 MED ORDER — INSULIN DETEMIR 100 UNIT/ML ~~LOC~~ SOLN: 16.0000 [IU] | Freq: Two times a day (BID) | SUBCUTANEOUS | Status: DC

## 2015-02-07 NOTE — Discharge Instructions (Addendum)
Cesarean Delivery, Care After Refer to this sheet in the next few weeks. These instructions provide you with information on caring for yourself after your procedure. Your health care provider may also give you specific instructions. Your treatment has been planned according to current medical practices, but problems sometimes occur. Call your health care provider if you have any problems or questions after you go home. HOME CARE INSTRUCTIONS  Only take over-the-counter or prescription medications as directed by your health care provider.  Do not drink alcohol, especially if you are breastfeeding or taking medication to relieve pain.  Do not chew or smoke tobacco.  Continue to use good perineal care. Good perineal care includes:  Wiping your perineum from front to back.  Keeping your perineum clean.  Check your surgical cut (incision) daily for increased redness, drainage, swelling, or separation of skin.  Clean your incision gently with soap and water every day, and then pat it dry. If your health care provider says it is okay, leave the incision uncovered. Use a bandage (dressing) if the incision is draining fluid or appears irritated. If the adhesive strips across the incision do not fall off within 7 days, carefully peel them off.  Hug a pillow when coughing or sneezing until your incision is healed. This helps to relieve pain.  Do not use tampons or douche for the next 6 weeks.  Showers only, no tub baths for next 6 weeks.   Wear a well-fitting bra that provides breast support.  Limit wearing support panties or control-top hose.  Drink enough fluids to keep your urine clear or pale yellow.  Eat high-fiber foods such as whole grain cereals and breads, brown rice, beans, and fresh fruits and vegetables every day. These foods may help prevent or relieve constipation.  No strenuous activities or heavy lifting for two weeks. Resume activities such as climbing stairs, driving,  lifting, exercising, or traveling gradually.   No sexual intercourse for the next 6 weeks.   Try to have someone help you with your household activities and your newborn for at least a few days after you leave the hospital.  Rest as much as possible. Try to rest or take a nap when your newborn is sleeping.  Increase your activities gradually.  Keep all of your scheduled postpartum appointments. It is very important to keep your scheduled follow-up appointments. At these appointments, your health care provider will be checking to make sure that you are healing physically and emotionally.  No driving for the next two weeks and when taking narcotic pain medicine. SEEK MEDICAL CARE IF:   You are passing large clots from your vagina. Save any clots to show your health care provider.  You have a foul smelling discharge from your vagina.  You have trouble urinating.  You are urinating frequently.  You have pain when you urinate.  You have a change in your bowel movements.  You have increasing redness, pain, or swelling near your incision.  You have pus draining from your incision.  Your incision is separating.  You have painful, hard, or reddened breasts.  You have a severe headache.  You have blurred vision or see spots.  You feel sad or depressed.  You have thoughts of hurting yourself or your newborn.  You have questions about your care, the care of your newborn, or medications.  You are dizzy or light-headed.  You have a rash.  You have pain, redness, or swelling at the site of the removed intravenous access (  IV) tube.  You have nausea or vomiting.  You stopped breastfeeding and have not had a menstrual period within 12 weeks of stopping.  You are not breastfeeding and have not had a menstrual period within 12 weeks of delivery.  You have a fever. SEEK IMMEDIATE MEDICAL CARE IF:  You have persistent pain.  You have chest pain.  You have shortness of  breath.  You faint.  You have leg pain.  You have stomach pain.  Your vaginal bleeding saturates 2 or more sanitary pads in 1 hour. MAKE SURE YOU:   Understand these instructions.  Will watch your condition.  Will get help right away if you are not doing well or get worse.

## 2015-02-07 NOTE — Discharge Summary (Signed)
Obstetric Discharge Summary Reason for Admission: cesarean section Prenatal Procedures: NST and ultrasound Intrapartum Procedures: cesarean: low cervical, transverse and tubal ligation Postpartum Procedures: none Complications-Operative and Postpartum: none    Discharged home with NPH 16u in pm and 16u in am. Pt has used 29u shortacting insulin per sliding scale over last 48 hrs. Will followup in 2 days.  HEMOGLOBIN  Date Value Ref Range Status  02/05/2015 10.2* 12.0 - 16.0 g/dL Final  57/84/6962 95.2 g/dL Final   HGB  Date Value Ref Range Status  01/24/2014 13.0 12.0-16.0 g/dL Final   HCT  Date Value Ref Range Status  02/05/2015 28.6* 35.0 - 47.0 % Final  09/02/2014 34 % Final  01/24/2014 39.6 35.0-47.0 % Final    Physical Exam:  General: alert, cooperative and no distress Lochia: appropriate Uterine Fundus: firm Incision: healing well, no significant drainage, no dehiscence, no significant erythema DVT Evaluation: No evidence of DVT seen on physical exam. Negative Homan's sign.  Discharge Diagnoses: Term Pregnancy-delivered and gestational diabetes  Discharge Information: Date: 02/07/2015 Activity: pelvic rest Diet: routine Medications: PNV, Ibuprofen and Colace Condition: stable Instructions: refer to practice specific booklet Discharge to: home Follow-up Information    Follow up with Victoria Douglas, MD In 2 weeks.   Specialty:  Obstetrics and Gynecology   Why:  For postop check   Contact information:   [address] [place] [address] [phone]       Newborn Data: Live born female  Birth Weight: 7 lb 13.2 oz (3550 g) APGAR: 8, 9  Home with mother.  Victoria Cortez 02/07/2015, 12:27 PM

## 2015-02-07 NOTE — Progress Notes (Signed)
Patient discharged home with infant. Vital signs stable, bleeding within normal limits, uterus firm. Discharge instructions, prescriptions, and follow up appointment given to and reviewed with patient. Patient verbalized understanding, all questions answered. Escorted in wheelchair by auxiliary.    

## 2015-02-07 NOTE — Progress Notes (Signed)
Inpatient Diabetes Program Recommendations  AACE/ADA: New Consensus Statement on Inpatient Glycemic Control (2013)  Target Ranges:  Prepandial:   less than 140 mg/dL      Peak postprandial:   less than 180 mg/dL (1-2 hours)      Critically ill patients:  140 - 180 mg/dL   Reason for Visit: consult  Diabetes history: Type 2 Outpatient Diabetes medications:     Patient being discharged home.  Instructed patient on the use of Levemir insulin using the insulin pen.  Used teach back with the patient who was unable to demonstrate the correct use of the pen- re-educated.  Reviewed the action of Levemir, timing, site rotation, storage and shelf life of insulin. Patient is to take insulin twice a day; fasting and in the evening approximately 12 hours apart. Instructed to change needle each time she gives insulin and to keep insulin away from her daughter (age 1).  Encouraged to use a sharps container for her needles and lancets and to educate her daughter regarding the dangers of touching her supplies.  Reviewed the symptoms of hypoglycemia and the treatment.  RN has ordered the insulin pen teaching kit for the patient.  Patient's cousin and aunt were in the room with the patient- both have diabetes and strongly supported my request of the patient to check blood sugars twice a day; fasting and 2 hours post prandial.  Patient admits to taking poor control of her blood sugars during pregnancy- both myself and her aunt talked about the long term health risks of poorly controlled diabetes including kidney and heart diease, potential for amputation and stroke.   Gentry Fitz, RN, BA, MHA, CDE Diabetes Coordinator Inpatient Diabetes Program  [phone] (Team Pager) [phone] ([place]) 02/07/2015 2:35 PM

## 2015-05-22 DIAGNOSIS — E1165 Type 2 diabetes mellitus with hyperglycemia: Secondary | ICD-10-CM | POA: Insufficient documentation

## 2015-05-22 DIAGNOSIS — Z720 Tobacco use: Secondary | ICD-10-CM | POA: Insufficient documentation

## 2015-07-18 ENCOUNTER — Encounter: Payer: Self-pay | Admitting: Emergency Medicine

## 2015-07-18 ENCOUNTER — Emergency Department
Admission: EM | Admit: 2015-07-18 | Discharge: 2015-07-18 | Disposition: A | Discharge status: Home / Self Care | Payer: Medicaid Other | Attending: Emergency Medicine | Admitting: Emergency Medicine

## 2015-07-18 DIAGNOSIS — J029 Acute pharyngitis, unspecified: Secondary | ICD-10-CM | POA: Diagnosis present

## 2015-07-18 DIAGNOSIS — Z79899 Other long term (current) drug therapy: Secondary | ICD-10-CM | POA: Insufficient documentation

## 2015-07-18 DIAGNOSIS — F17200 Nicotine dependence, unspecified, uncomplicated: Secondary | ICD-10-CM | POA: Insufficient documentation

## 2015-07-18 DIAGNOSIS — E119 Type 2 diabetes mellitus without complications: Secondary | ICD-10-CM | POA: Insufficient documentation

## 2015-07-18 DIAGNOSIS — Z794 Long term (current) use of insulin: Secondary | ICD-10-CM | POA: Insufficient documentation

## 2015-07-18 DIAGNOSIS — J039 Acute tonsillitis, unspecified: Secondary | ICD-10-CM | POA: Diagnosis not present

## 2015-07-18 MED ORDER — PSEUDOEPH-BROMPHEN-DM 30-2-10 MG/5ML PO SYRP: 5.0000 mL | ORAL_SOLUTION | Freq: Four times a day (QID) | ORAL | Status: DC | PRN

## 2015-07-18 MED ORDER — IBUPROFEN 800 MG PO TABS: 800.0000 mg | ORAL_TABLET | Freq: Three times a day (TID) | ORAL | Status: DC | PRN

## 2015-07-18 MED ORDER — AMOXICILLIN 500 MG PO CAPS: 500.0000 mg | ORAL_CAPSULE | Freq: Three times a day (TID) | ORAL | Status: DC

## 2015-07-18 MED ORDER — LIDOCAINE VISCOUS 2 % MT SOLN: 5.0000 mL | Freq: Four times a day (QID) | OROMUCOSAL | Status: DC | PRN

## 2015-07-18 NOTE — ED Notes (Signed)
States sore throat for about 2 days   Increase pain with swallowing

## 2015-07-18 NOTE — ED Notes (Signed)
Sore throat since sat pm

## 2015-07-18 NOTE — ED Provider Notes (Signed)
[facility] Emergency Department Provider Note  ____________________________________________  Time seen: Approximately 2:46 PM  I have reviewed the triage vital signs and the nursing notes.   HISTORY  Chief Complaint No chief complaint on file.    HPI Victoria Cortez is a 30 y.o. female patient complaining of sore throat for 3 days. He stated this morning she noticed white spots on tonsils. Patient state she has pain with swallowing but can tolerate food and fluids. Patient rating her pain as 8/10. Patient also complaining of nasal congestion and postnasal drainage. No palliative measures taken for this complaint.   Past Medical History  Diagnosis Date  . Diabetes mellitus without complication (HCC)   . Sickle cell trait [facility])     Patient Active Problem List   Diagnosis Date Noted  . H/O cesarean section 02/03/2015  . S/P cesarean section 02/03/2015  . Uncontrolled diabetes mellitus (HCC) 01/12/2015  . Labor and delivery, indication for care 01/09/2015  . Pregnancy 11/26/2014    Past Surgical History  Procedure Laterality Date  . Cesarean section    . Cesarean section with bilateral tubal ligation N/A 02/03/2015    Procedure: CESAREAN SECTION WITH BILATERAL TUBAL LIGATION;  Surgeon: Christeen Douglas, MD;  Location: [facility] ORS;  Service: Obstetrics;  Laterality: N/A;    Current Outpatient Rx  Name  Route  Sig  Dispense  Refill  . acetaminophen (TYLENOL) 325 MG tablet   Oral   Take 2 tablets (650 mg total) by mouth every 4 (four) hours as needed (for pain scale < 4).   30 tablet   1   . amoxicillin (AMOXIL) 500 MG capsule   Oral   Take 1 capsule (500 mg total) by mouth 3 (three) times daily.   30 capsule   0   . brompheniramine-pseudoephedrine-DM 30-2-10 MG/5ML syrup   Oral   Take 5 mLs by mouth 4 (four) times daily as needed. Mixed with 5 mL of viscous lidocaine for swish and swallow   120 mL   0   . ibuprofen (ADVIL,MOTRIN) 600 MG  tablet   Oral   Take 1 tablet (600 mg total) by mouth every 6 (six) hours.   30 tablet   0   . ibuprofen (ADVIL,MOTRIN) 800 MG tablet   Oral   Take 1 tablet (800 mg total) by mouth every 8 (eight) hours as needed.   30 tablet   0   . insulin detemir (LEVEMIR) 100 UNIT/ML injection   Subcutaneous   Inject 0.16 mLs (16 Units total) into the skin 2 (two) times daily. In evening and morning.   10 mL   11   . lidocaine (XYLOCAINE) 2 % solution   Mouth/Throat   Use as directed 5 mLs in the mouth or throat every 6 (six) hours as needed for mouth pain. Mixed with 5 mL of Bromfed-DM for swish and swallow   100 mL   0   . metroNIDAZOLE (FLAGYL) 500 MG tablet   Oral   Take 1 tablet (500 mg total) by mouth every 12 (twelve) hours. Patient not taking: Reported on 02/02/2015   5 tablet   0   . oxyCODONE-acetaminophen (PERCOCET/ROXICET) 5-325 MG per tablet   Oral   Take 1 tablet by mouth every 4 (four) hours as needed (for pain scale 4-7).   30 tablet   0   . Prenatal Vit-Fe Fumarate-FA (PRENATAL MULTIVITAMIN) TABS tablet   Oral   Take 1 tablet by mouth daily at 12  noon.   60 tablet   3     Allergies Review of patient's allergies indicates no known allergies.  No family history on file.  Social History Social History  Substance Use Topics  . Smoking status: Current Every Day Smoker -- 0.50 packs/day  . Smokeless tobacco: Never Used  . Alcohol Use: No    Review of Systems Constitutional: No fever/chills Eyes: No visual changes. ENT: Sore throat Cardiovascular: Denies chest pain. Respiratory: Denies shortness of breath. Gastrointestinal: No abdominal pain.  No nausea, no vomiting.  No diarrhea.  No constipation. Genitourinary: Negative for dysuria. Musculoskeletal: Negative for back pain. Skin: Negative for rash. Neurological: Negative for headaches, focal weakness or numbness. 10-point ROS otherwise  negative.  ____________________________________________   PHYSICAL EXAM:  VITAL SIGNS: ED Triage Vitals  Enc Vitals Group     BP --      Pulse --      Resp --      Temp --      Temp src --      SpO2 --      Weight --      Height --      Head Cir --      Peak Flow --      Pain Score --      Pain Loc --      Pain Edu? --      Excl. in GC? --     Constitutional: Alert and oriented. Well appearing and in no acute distress. Eyes: Conjunctivae are normal. PERRL. EOMI. Head: Atraumatic. Nose: No congestion/rhinnorhea. Mouth/Throat: Mucous membranes are moist.  Oropharynx erythematous with bilateral exudative tonsils Neck: No stridor.  No cervical spine tenderness to palpation. Hematological/Lymphatic/Immunilogical: Bilateral cervical lymphadenopathy. Cardiovascular: Normal rate, regular rhythm. Grossly normal heart sounds.  Good peripheral circulation. Respiratory: Normal respiratory effort.  No retractions. Lungs CTAB. Gastrointestinal: Soft and nontender. No distention. No abdominal bruits. No CVA tenderness. Musculoskeletal: No lower extremity tenderness nor edema.  No joint effusions. Neurologic:  Normal speech and language. No gross focal neurologic deficits are appreciated. No gait instability. Skin:  Skin is warm, dry and intact. No rash noted. Psychiatric: Mood and affect are normal. Speech and behavior are normal.  ____________________________________________   LABS (all labs ordered are listed, but only abnormal results are displayed)  Labs Reviewed - No data to display ____________________________________________  EKG   ____________________________________________  RADIOLOGY   ____________________________________________   PROCEDURES  Procedure(s) performed: None  Critical Care performed: No  ____________________________________________   INITIAL IMPRESSION / ASSESSMENT AND PLAN / ED COURSE  Pertinent labs & imaging results that were  available during my care of the patient were reviewed by me and considered in my medical decision making (see chart for details).  Tonsillitis with exudate. Patient given discharge care instructions. Patient given prescription for amoxicillin, Bromfed-DM, ibuprofen, and viscous lidocaine. Patient advised [facility] condition [facility] past medication usage. ____________________________________________   FINAL CLINICAL IMPRESSION(S) / ED DIAGNOSES  Final diagnoses:  Tonsillitis with exudate      Joni Reining, PA-C 07/18/15 1458  Rockne Menghini, MD 07/18/15 [phone]

## 2015-07-18 NOTE — Discharge Instructions (Signed)
Tonsillitis °Tonsillitis is an infection of the throat. This infection causes the tonsils to become red, tender, and puffy (swollen). Tonsils are groups of tissue at the back of your throat. If bacteria caused your infection, antibiotic medicine will be given to you. Sometimes symptoms of tonsillitis can be relieved with the use of steroid medicine. If your tonsillitis is severe and happens often, you may need to get your tonsils removed (tonsillectomy). °HOME CARE  °· Rest and sleep often. °· Drink enough fluids to keep your pee (urine) clear or pale yellow. °· While your throat is sore, eat soft or liquid foods like: °¨ Soup. °¨ Ice cream. °¨ Instant breakfast drinks. °· Eat frozen ice pops. °· Gargle with a warm or cold liquid to help soothe the throat. Gargle with a water and salt mix. Mix 1/4 teaspoon of salt and 1/4 teaspoon of baking soda in 1 cup of water. °· Only take medicines as told by your doctor. °· If you are given medicines (antibiotics), take them as told. Finish them even if you start to feel better. °GET HELP IF: °· You have large, tender lumps in your neck. °· You have a rash. °· You cough up green, yellow-brown, or bloody fluid. °· You cannot swallow liquids or food for 24 hours. °· You notice that only one of your tonsils is swollen. °GET HELP RIGHT AWAY IF:  °· You throw up (vomit). °· You have a very bad headache. °· You have a stiff neck. °· You have chest pain. °· You have trouble breathing or swallowing. °· You have bad throat pain, drooling, or your voice changes. °· You have bad pain not helped by medicine. °· You cannot fully open your mouth. °· You have redness, puffiness, or bad pain in the neck. °· You have a fever. °MAKE SURE YOU:  °· Understand these instructions. °· Will watch your condition. °· Will get help right away if you are not doing well or get worse. °  °This information is not intended to replace advice given to you by your health care provider. Make sure you discuss any  questions you have with your health care provider. °  °Document Released: 12/04/2007 Document Revised: 06/22/2013 Document Reviewed: 12/04/2012 °Elsevier Interactive Patient Education ©2016 [employer] ° °

## 2015-10-15 ENCOUNTER — Encounter: Payer: Self-pay | Admitting: Emergency Medicine

## 2015-10-15 ENCOUNTER — Emergency Department
Admission: EM | Admit: 2015-10-15 | Discharge: 2015-10-15 | Disposition: A | Discharge status: Home / Self Care | Payer: Medicaid Other | Attending: Emergency Medicine | Admitting: Emergency Medicine

## 2015-10-15 DIAGNOSIS — F17200 Nicotine dependence, unspecified, uncomplicated: Secondary | ICD-10-CM | POA: Diagnosis not present

## 2015-10-15 DIAGNOSIS — N390 Urinary tract infection, site not specified: Secondary | ICD-10-CM | POA: Diagnosis not present

## 2015-10-15 DIAGNOSIS — D573 Sickle-cell trait: Secondary | ICD-10-CM | POA: Diagnosis not present

## 2015-10-15 DIAGNOSIS — E119 Type 2 diabetes mellitus without complications: Secondary | ICD-10-CM | POA: Diagnosis not present

## 2015-10-15 DIAGNOSIS — M549 Dorsalgia, unspecified: Secondary | ICD-10-CM | POA: Diagnosis not present

## 2015-10-15 DIAGNOSIS — Z794 Long term (current) use of insulin: Secondary | ICD-10-CM | POA: Insufficient documentation

## 2015-10-15 DIAGNOSIS — L0291 Cutaneous abscess, unspecified: Secondary | ICD-10-CM | POA: Diagnosis not present

## 2015-10-15 DIAGNOSIS — L089 Local infection of the skin and subcutaneous tissue, unspecified: Secondary | ICD-10-CM | POA: Diagnosis not present

## 2015-10-15 DIAGNOSIS — R8299 Other abnormal findings in urine: Secondary | ICD-10-CM | POA: Diagnosis present

## 2015-10-15 LAB — CBC
HCT: 31.2 % — ABNORMAL LOW (ref 35.0–47.0)
HEMOGLOBIN: 10.6 g/dL — AB (ref 12.0–16.0)
MCH: 27.8 pg (ref 26.0–34.0)
MCHC: 34 g/dL (ref 32.0–36.0)
MCV: 82 fL (ref 80.0–100.0)
Platelets: 288 10*3/uL (ref 150–440)
RBC: 3.81 MIL/uL (ref 3.80–5.20)
RDW: 14.5 % (ref 11.5–14.5)
WBC: 13.7 10*3/uL — ABNORMAL HIGH (ref 3.6–11.0)

## 2015-10-15 LAB — BASIC METABOLIC PANEL
ANION GAP: 12 (ref 5–15)
BUN: 10 mg/dL (ref 6–20)
CALCIUM: 8.5 mg/dL — AB (ref 8.9–10.3)
CHLORIDE: 96 mmol/L — AB (ref 101–111)
CO2: 22 mmol/L (ref 22–32)
Creatinine, Ser: 1.03 mg/dL — ABNORMAL HIGH (ref 0.44–1.00)
GFR calc non Af Amer: >60 mL/min (ref >60)
GLUCOSE: 275 mg/dL — AB (ref 65–99)
POTASSIUM: 3.5 mmol/L (ref 3.5–5.1)
Sodium: 130 mmol/L — ABNORMAL LOW (ref 135–145)

## 2015-10-15 LAB — URINALYSIS COMPLETE WITH MICROSCOPIC (ARMC ONLY)
BILIRUBIN URINE: NEGATIVE
Glucose, UA: >500 mg/dL — AB
Nitrite: NEGATIVE
PROTEIN: 100 mg/dL — AB
Specific Gravity, Urine: 1.01 (ref 1.005–1.030)
pH: 6 (ref 5.0–8.0)

## 2015-10-15 LAB — POCT PREGNANCY, URINE: Preg Test, Ur: NEGATIVE

## 2015-10-15 MED ORDER — SULFAMETHOXAZOLE-TRIMETHOPRIM 800-160 MG PO TABS: 1.0000 | ORAL_TABLET | Freq: Two times a day (BID) | ORAL | Status: DC

## 2015-10-15 MED ORDER — OXYCODONE-ACETAMINOPHEN 5-325 MG PO TABS
ORAL_TABLET | ORAL | Status: AC
  Filled 2015-10-15: qty 1

## 2015-10-15 MED ORDER — SULFAMETHOXAZOLE-TRIMETHOPRIM 800-160 MG PO TABS
ORAL_TABLET | ORAL | Status: AC
  Administered 2015-10-15: 1 via ORAL
  Filled 2015-10-15: qty 1

## 2015-10-15 MED ORDER — FUROSEMIDE 40 MG PO TABS
ORAL_TABLET | ORAL | Status: AC
  Filled 2015-10-15: qty 1

## 2015-10-15 MED ORDER — SULFAMETHOXAZOLE-TRIMETHOPRIM 800-160 MG PO TABS
1.0000 | ORAL_TABLET | Freq: Once | ORAL | Status: AC
  Administered 2015-10-15: 1 via ORAL

## 2015-10-15 MED ORDER — TRAMADOL HCL 50 MG PO TABS: 50.0000 mg | ORAL_TABLET | Freq: Four times a day (QID) | ORAL | Status: DC | PRN

## 2015-10-15 NOTE — ED Notes (Signed)
C/O right side pain x 2-3 days.  Also c/o foul smelling urine.  Also c/o a "boil" to left groin.

## 2015-10-15 NOTE — ED Provider Notes (Signed)
[facility] Emergency Department Provider Note  Time seen: 3:49 PM  I have reviewed the triage vital signs and the nursing notes.   HISTORY  Chief Complaint Back Pain; foul smelling urine ; and Recurrent Skin Infections    HPI Victoria Cortez is a 30 y.o. female with a past medical history of diabetes who presents the emergency department with back pain, foul-smelling urine, and a abscess to her left groin. According to the patient for the past for 5 days she has noticed an enlarging abscess to the left groin. She states several hours ago and it spontaneously ruptured. Patient denies fever or vomiting. Denies a history of abscess in the past. Denies dysuria or hematuria. Denies vaginal symptoms. Describes her lower back pain as mild. Describes her pain in her left groin as moderate or the abscess is located.     Past Medical History  Diagnosis Date  . Diabetes mellitus without complication (HCC)   . Sickle cell trait [facility](HCC)     Patient Active Problem List   Diagnosis Date Noted  . H/O cesarean section 02/03/2015  . S/P cesarean section 02/03/2015  . Uncontrolled diabetes mellitus (HCC) 01/12/2015  . Labor and delivery, indication for care 01/09/2015  . Pregnancy 11/26/2014    Past Surgical History  Procedure Laterality Date  . Cesarean section    . Cesarean section with bilateral tubal ligation N/A 02/03/2015    Procedure: CESAREAN SECTION WITH BILATERAL TUBAL LIGATION;  Surgeon: Christeen DouglasBethany Beasley, MD;  Location: [facility] ORS;  Service: Obstetrics;  Laterality: N/A;    Current Outpatient Rx  Name  Route  Sig  Dispense  Refill  . acetaminophen (TYLENOL) 325 MG tablet   Oral   Take 2 tablets (650 mg total) by mouth every 4 (four) hours as needed (for pain scale < 4).   30 tablet   1   . amoxicillin (AMOXIL) 500 MG capsule   Oral   Take 1 capsule (500 mg total) by mouth 3 (three) times daily.   30 capsule   0   . brompheniramine-pseudoephedrine-DM  30-2-10 MG/5ML syrup   Oral   Take 5 mLs by mouth 4 (four) times daily as needed. Mixed with 5 mL of viscous lidocaine for swish and swallow   120 mL   0   . ibuprofen (ADVIL,MOTRIN) 600 MG tablet   Oral   Take 1 tablet (600 mg total) by mouth every 6 (six) hours.   30 tablet   0   . ibuprofen (ADVIL,MOTRIN) 800 MG tablet   Oral   Take 1 tablet (800 mg total) by mouth every 8 (eight) hours as needed.   30 tablet   0   . insulin detemir (LEVEMIR) 100 UNIT/ML injection   Subcutaneous   Inject 0.16 mLs (16 Units total) into the skin 2 (two) times daily. In evening and morning.   10 mL   11   . lidocaine (XYLOCAINE) 2 % solution   Mouth/Throat   Use as directed 5 mLs in the mouth or throat every 6 (six) hours as needed for mouth pain. Mixed with 5 mL of Bromfed-DM for swish and swallow   100 mL   0   . metroNIDAZOLE (FLAGYL) 500 MG tablet   Oral   Take 1 tablet (500 mg total) by mouth every 12 (twelve) hours. Patient not taking: Reported on 02/02/2015   5 tablet   0   . oxyCODONE-acetaminophen (PERCOCET/ROXICET) 5-325 MG per tablet   Oral  Take 1 tablet by mouth every 4 (four) hours as needed (for pain scale 4-7).   30 tablet   0   . Prenatal Vit-Fe Fumarate-FA (PRENATAL MULTIVITAMIN) TABS tablet   Oral   Take 1 tablet by mouth daily at 12 noon.   60 tablet   3     Allergies Review of patient's allergies indicates no known allergies.  No family history on file.  Social History Social History  Substance Use Topics  . Smoking status: Current Every Day Smoker -- 0.50 packs/day  . Smokeless tobacco: Never Used  . Alcohol Use: No    Review of Systems Constitutional: Negative for fever. Cardiovascular: Negative for chest pain. Respiratory: Negative for shortness of breath. Gastrointestinal: Negative for abdominal pain. Negative for nausea or vomiting. Genitourinary: Negative for dysuria. Negative for hematuria. Positive for foul odor to her  urine. Musculoskeletal: Mild lower back pain Neurological: Negative for headache 10-point ROS otherwise negative.  ____________________________________________   PHYSICAL EXAM:  VITAL SIGNS: ED Triage Vitals  Enc Vitals Group     BP 10/15/15 1453 114/67 mmHg     Pulse Rate 10/15/15 1453 119     Resp 10/15/15 1453 16     Temp 10/15/15 1453 100.4 F (38 C)     Temp Source 10/15/15 1453 Oral     SpO2 10/15/15 1453 100 %     Weight 10/15/15 1453 165 lb (74.844 kg)     Height 10/15/15 1453 5' (1.524 m)     Head Cir --      Peak Flow --      Pain Score 10/15/15 1455 10     Pain Loc --      Pain Edu? --      Excl. in GC? --     Constitutional: Alert and oriented. Well appearing and in no distress. Eyes: Normal exam ENT   Head: Normocephalic and atraumatic.   Mouth/Throat: Mucous membranes are moist. Cardiovascular: Normal rate, regular rhythm. No murmur Respiratory: Normal respiratory effort without tachypnea nor retractions. Breath sounds are clear Gastrointestinal: Soft and nontender. No distention.  There is no CVA tenderness. Musculoskeletal: Patient has an approximate 2 x 2 cm abscess to her left groin which has spontaneously ruptured. During examination I was able to express approximately 10 cc of exudate. The area now appears flat, nondistended. Neurologic:  Normal speech and language. No gross focal neurologic deficits Skin:  Skin is warm, dry and intact.  Psychiatric: Mood and affect are normal.  ____________________________________________    INITIAL IMPRESSION / ASSESSMENT AND PLAN / ED COURSE  Pertinent labs & imaging results that were available during my care of the patient were reviewed by me and considered in my medical decision making (see chart for details).  Patient presents for lower back pain, foul smell to her urine as well as an abscess to her left groin. The abscess has ruptured spontaneously however I was able to express approximately 10 cc of  pus. The area now appears flat. Urinalysis has resulted showing a urinary tract infection. We will send a urine culture. Blood work shows a slight white blood cell count elevation. Patient's back pain appears to be lower, not in the CVA area. Possible early pyelonephritis versus cystitis. Given her abscess plus urinary tract infection we'll place the patient on Bactrim which should provide adequate coverage for both. Patient will follow with a primary care physician several days for recheck. I discussed return precautions with the patient which she is agreeable.  ____________________________________________  FINAL CLINICAL IMPRESSION(S) / ED DIAGNOSES  Urinary tract infection Abscess   Minna Antis, MD 10/15/15 1554

## 2015-10-15 NOTE — Discharge Instructions (Signed)
Please follow-up with your primary care physician in 2-3 days for recheck/reevaluation. Return to the emergency department for any fever, vomiting unable to keep down your antibiotics, increased discomfort, or any other symptom personally concerning to yourself.   Abscess An abscess is an infected area that contains a collection of pus and debris.It can occur in almost any part of the body. An abscess is also known as a furuncle or boil. CAUSES  An abscess occurs when tissue gets infected. This can occur from blockage of oil or sweat glands, infection of hair follicles, or a minor injury to the skin. As the body tries to fight the infection, pus collects in the area and creates pressure under the skin. This pressure causes pain. People with weakened immune systems have difficulty fighting infections and get certain abscesses more often.  SYMPTOMS Usually an abscess develops on the skin and becomes a painful mass that is red, warm, and tender. If the abscess forms under the skin, you may feel a moveable soft area under the skin. Some abscesses break open (rupture) on their own, but most will continue to get worse without care. The infection can spread deeper into the body and eventually into the bloodstream, causing you to feel ill.  DIAGNOSIS  Your caregiver will take your medical history and perform a physical exam. A sample of fluid may also be taken from the abscess to determine what is causing your infection. TREATMENT  Your caregiver may prescribe antibiotic medicines to fight the infection. However, taking antibiotics alone usually does not cure an abscess. Your caregiver may need to make a small cut (incision) in the abscess to drain the pus. In some cases, gauze is packed into the abscess to reduce pain and to continue draining the area. HOME CARE INSTRUCTIONS   Only take over-the-counter or prescription medicines for pain, discomfort, or fever as directed by your caregiver.  If you were  prescribed antibiotics, take them as directed. Finish them even if you start to feel better.  If gauze is used, follow your caregiver's directions for changing the gauze.  To avoid spreading the infection:  Keep your draining abscess covered with a bandage.  Wash your hands well.  Do not share personal care items, towels, or whirlpools with others.  Avoid skin contact with others.  Keep your skin and clothes clean around the abscess.  Keep all follow-up appointments as directed by your caregiver. SEEK MEDICAL CARE IF:   You have increased pain, swelling, redness, fluid drainage, or bleeding.  You have muscle aches, chills, or a general ill feeling.  You have a fever. MAKE SURE YOU:   Understand these instructions.  Will watch your condition.  Will get help right away if you are not doing well or get worse.   This information is not intended to replace advice given to you by your health care provider. Make sure you discuss any questions you have with your health care provider.   Document Released: 03/27/2005 Document Revised: 12/17/2011 Document Reviewed: 08/30/2011 Elsevier Interactive Patient Education 2016 [employer]  Urinary Tract Infection A urinary tract infection (UTI) can occur any place along the urinary tract. The tract includes the kidneys, ureters, bladder, and urethra. A type of germ called bacteria often causes a UTI. UTIs are often helped with antibiotic medicine.  HOME CARE   If given, take antibiotics as told by your doctor. Finish them even if you start to feel better.  Drink enough fluids to keep your pee (urine) clear  or pale yellow.  Avoid tea, drinks with caffeine, and bubbly (carbonated) drinks.  Pee often. Avoid holding your pee in for a long time.  Pee before and after having sex (intercourse).  Wipe from front to back after you poop (bowel movement) if you are a woman. Use each tissue only once. GET HELP RIGHT AWAY IF:   You have back  pain.  You have lower belly (abdominal) pain.  You have chills.  You feel sick to your stomach (nauseous).  You throw up (vomit).  Your burning or discomfort with peeing does not go away.  You have a fever.  Your symptoms are not better in 3 days. MAKE SURE YOU:   Understand these instructions.  Will watch your condition.  Will get help right away if you are not doing well or get worse.   This information is not intended to replace advice given to you by your health care provider. Make sure you discuss any questions you have with your health care provider.   Document Released: 12/04/2007 Document Revised: 07/08/2014 Document Reviewed: 01/16/2012 Elsevier Interactive Patient Education [employer]

## 2015-10-24 IMAGING — US US OB LIMITED
1 series · 14 of 23 positions shown · non-contrast
Comparison: none

CLINICAL DATA: Diabetes mellitus in pregnancy

EXAM:
LIMITED OBSTETRIC ULTRASOUND

[Series 1: us ob limited · 0.26mm/px · 14 of 23 slices shown]
[im 1/23]
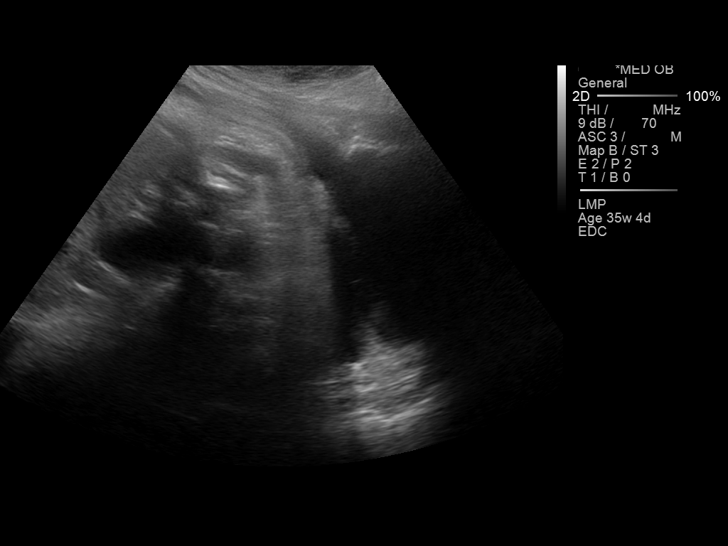
[im 3/23]
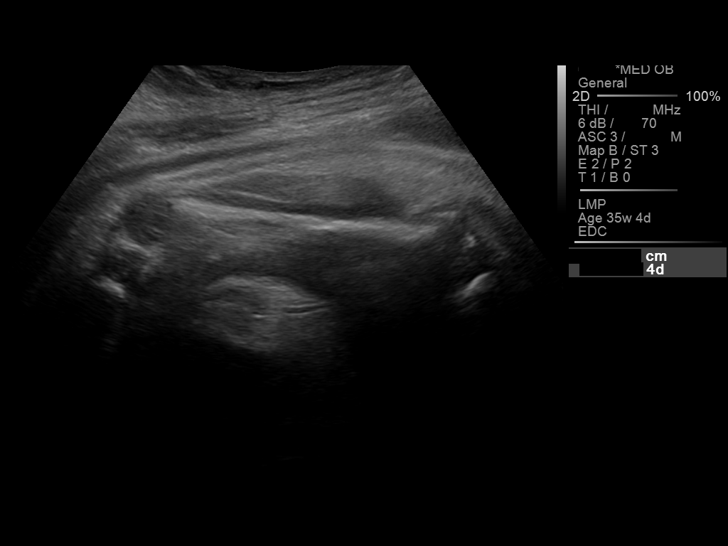
[im 5/23]
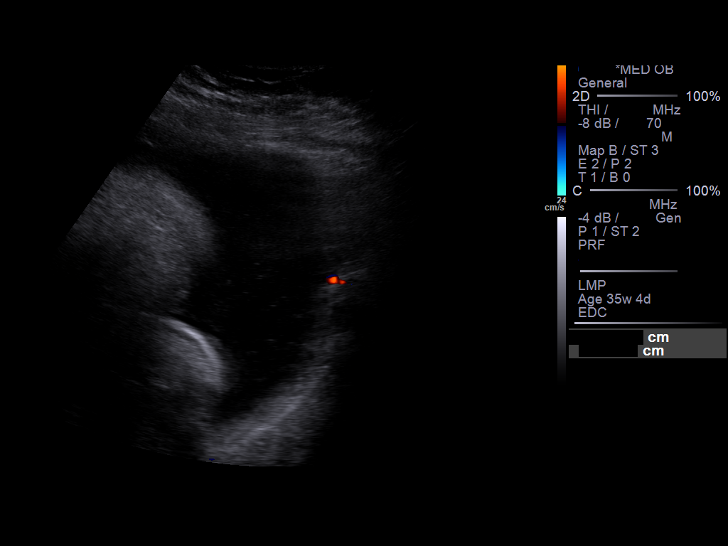
[im 6/23]
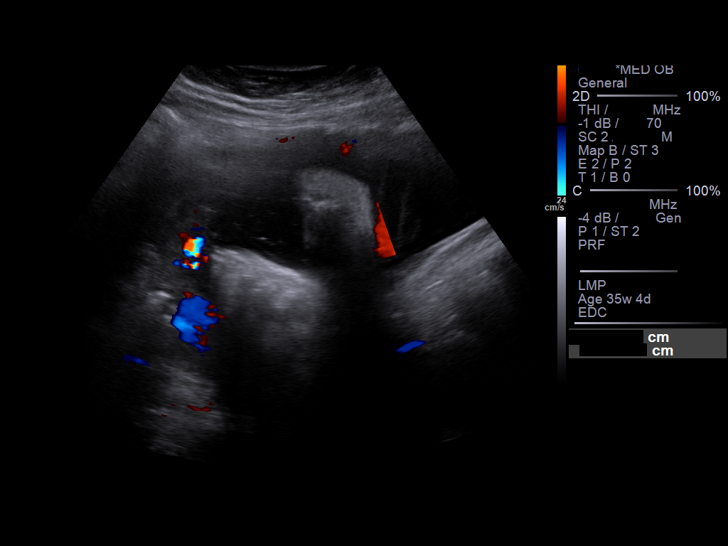
[im 8/23]
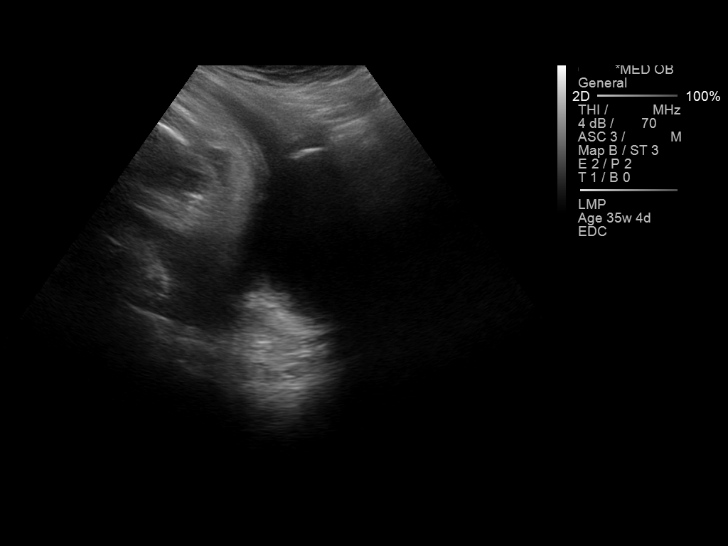
[im 10/23]
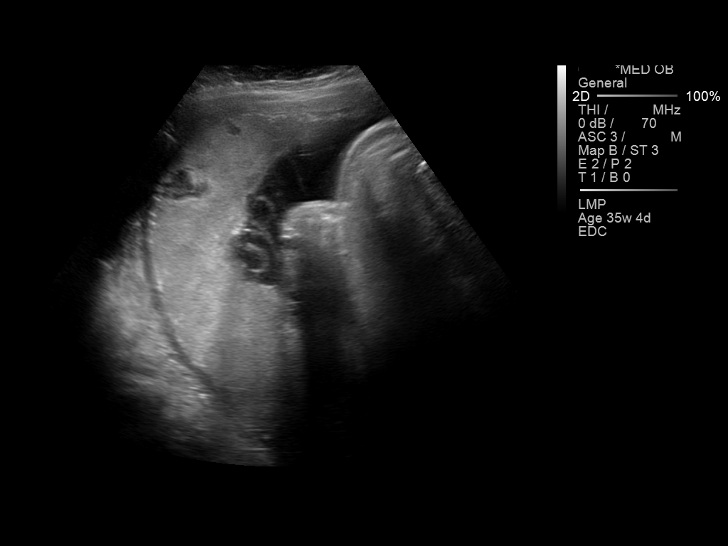
[im 11/23]
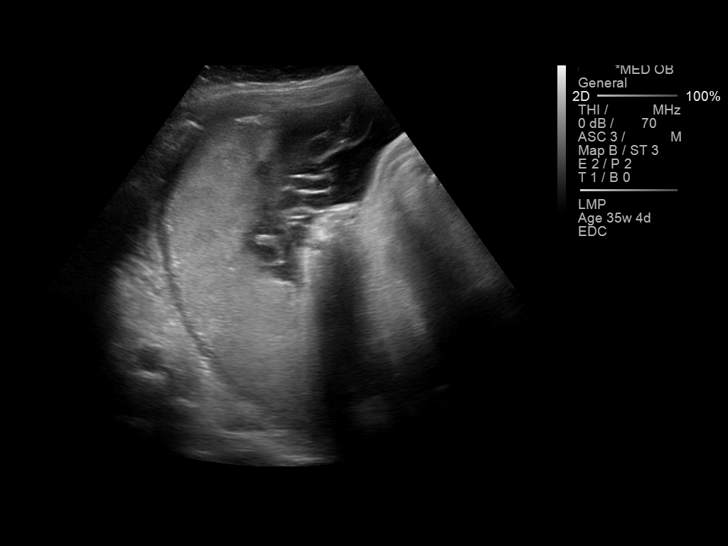
[im 13/23]
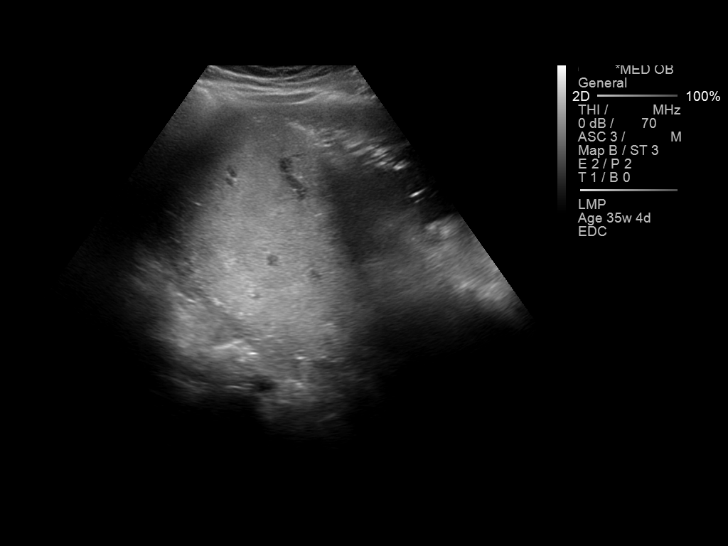
[im 14/23]
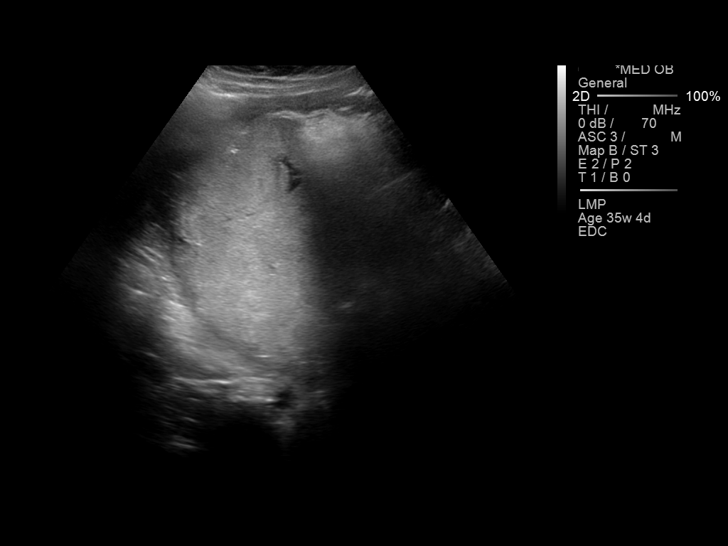
[im 16/23]
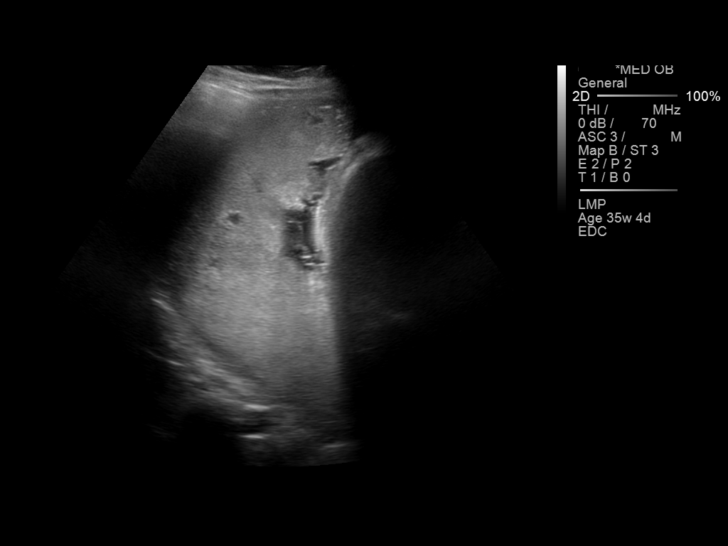
[im 18/23]
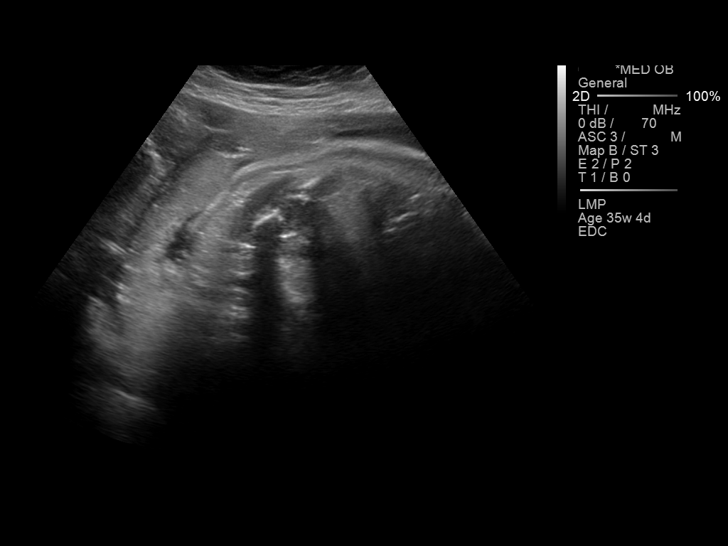
[im 19/23]
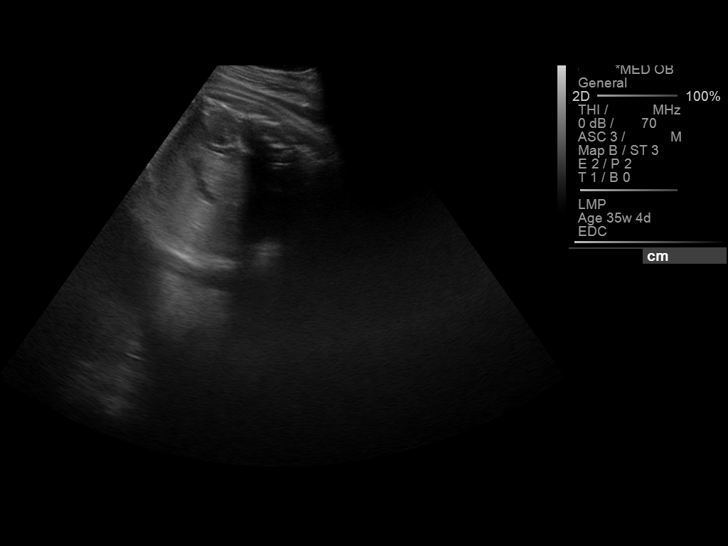
[im 21/23]
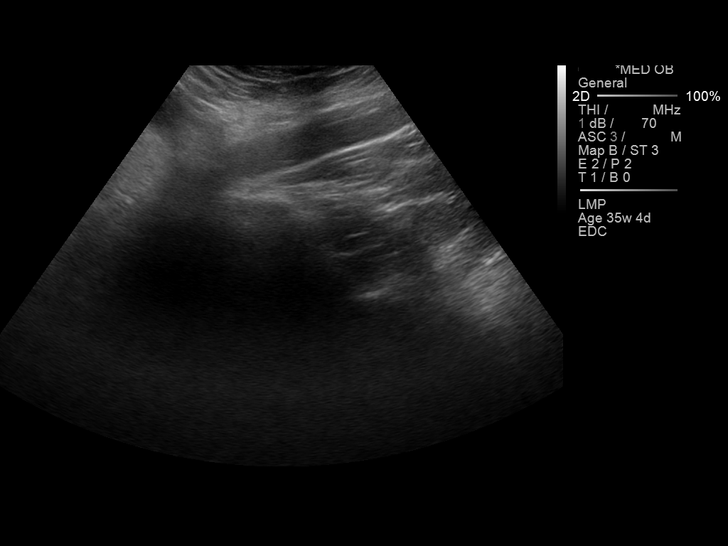
[im 23/23]
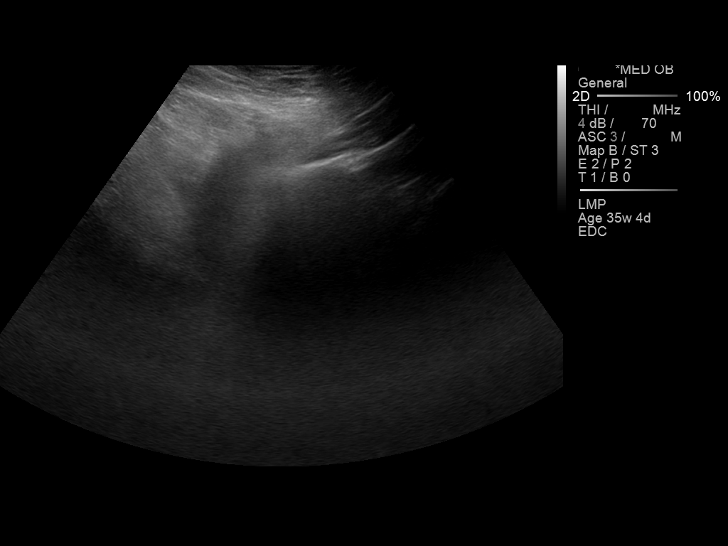

[14 of 23 positions shown; findings below may reference images not displayed]

FINDINGS: Number of Fetuses: 1

Heart Rate:  169 bpm

Movement: Yes

Presentation: Breech

Placental Location: Fundal

Previa: No

Amniotic Fluid (Subjective):  Within normal limits.

AFI:  14.8 cm

FL:  6.93 cm 35 w 4 d

MATERNAL FINDINGS:

Cervix:  Not evaluated

Uterus/Adnexae:  Grossly unremarkable
IMPRESSION: Single live intrauterine gestation in breech presentation as above.

This exam is performed on an emergent basis and does not
comprehensively evaluate fetal size, dating, or anatomy; follow-up
complete OB US should be considered if further fetal assessment is
warranted.

## 2015-11-28 ENCOUNTER — Emergency Department
Admission: EM | Admit: 2015-11-28 | Discharge: 2015-11-28 | Disposition: A | Discharge status: Home / Self Care | Payer: Medicaid Other | Source: Home / Self Care | Attending: Emergency Medicine | Admitting: Emergency Medicine

## 2015-11-28 ENCOUNTER — Emergency Department: Payer: Medicaid Other

## 2015-11-28 ENCOUNTER — Encounter: Payer: Self-pay | Admitting: Occupational Medicine

## 2015-11-28 DIAGNOSIS — D573 Sickle-cell trait: Secondary | ICD-10-CM | POA: Diagnosis present

## 2015-11-28 DIAGNOSIS — R52 Pain, unspecified: Secondary | ICD-10-CM | POA: Insufficient documentation

## 2015-11-28 DIAGNOSIS — Z794 Long term (current) use of insulin: Secondary | ICD-10-CM

## 2015-11-28 DIAGNOSIS — Y92239 Unspecified place in hospital as the place of occurrence of the external cause: Secondary | ICD-10-CM | POA: Diagnosis not present

## 2015-11-28 DIAGNOSIS — E119 Type 2 diabetes mellitus without complications: Secondary | ICD-10-CM

## 2015-11-28 DIAGNOSIS — A4189 Other specified sepsis: Principal | ICD-10-CM | POA: Diagnosis present

## 2015-11-28 DIAGNOSIS — N12 Tubulo-interstitial nephritis, not specified as acute or chronic: Secondary | ICD-10-CM | POA: Insufficient documentation

## 2015-11-28 DIAGNOSIS — E876 Hypokalemia: Secondary | ICD-10-CM | POA: Diagnosis present

## 2015-11-28 DIAGNOSIS — E1165 Type 2 diabetes mellitus with hyperglycemia: Secondary | ICD-10-CM | POA: Diagnosis present

## 2015-11-28 DIAGNOSIS — L270 Generalized skin eruption due to drugs and medicaments taken internally: Secondary | ICD-10-CM | POA: Diagnosis not present

## 2015-11-28 DIAGNOSIS — F17210 Nicotine dependence, cigarettes, uncomplicated: Secondary | ICD-10-CM

## 2015-11-28 DIAGNOSIS — T361X5A Adverse effect of cephalosporins and other beta-lactam antibiotics, initial encounter: Secondary | ICD-10-CM | POA: Diagnosis not present

## 2015-11-28 DIAGNOSIS — Z888 Allergy status to other drugs, medicaments and biological substances status: Secondary | ICD-10-CM

## 2015-11-28 DIAGNOSIS — R509 Fever, unspecified: Secondary | ICD-10-CM

## 2015-11-28 LAB — CBC WITH DIFFERENTIAL/PLATELET
Basophils Absolute: 0 10*3/uL (ref 0–0.1)
Eosinophils Absolute: 0 10*3/uL (ref 0–0.7)
Eosinophils Relative: 0 %
HCT: 35.5 % (ref 35.0–47.0)
HEMOGLOBIN: 12 g/dL (ref 12.0–16.0)
LYMPHS ABS: 1.8 10*3/uL (ref 1.0–3.6)
MCH: 28.4 pg (ref 26.0–34.0)
MCHC: 33.7 g/dL (ref 32.0–36.0)
MCV: 84.2 fL (ref 80.0–100.0)
Monocytes Absolute: 1.2 10*3/uL — ABNORMAL HIGH (ref 0.2–0.9)
NEUTROS ABS: 15.8 10*3/uL — AB (ref 1.4–6.5)
Platelets: 243 10*3/uL (ref 150–440)
RBC: 4.22 MIL/uL (ref 3.80–5.20)
RDW: 18.2 % — ABNORMAL HIGH (ref 11.5–14.5)
WBC: 18.9 10*3/uL — ABNORMAL HIGH (ref 3.6–11.0)

## 2015-11-28 LAB — URINALYSIS COMPLETE WITH MICROSCOPIC (ARMC ONLY)
Bilirubin Urine: NEGATIVE
Glucose, UA: 150 mg/dL — AB
Nitrite: NEGATIVE
PH: 6 (ref 5.0–8.0)
Protein, ur: 30 mg/dL — AB
SPECIFIC GRAVITY, URINE: 1.014 (ref 1.005–1.030)

## 2015-11-28 LAB — COMPREHENSIVE METABOLIC PANEL
ALBUMIN: 3.7 g/dL (ref 3.5–5.0)
ALT: 10 U/L — ABNORMAL LOW (ref 14–54)
ANION GAP: 10 (ref 5–15)
AST: 12 U/L — ABNORMAL LOW (ref 15–41)
Alkaline Phosphatase: 67 U/L (ref 38–126)
BUN: 8 mg/dL (ref 6–20)
CHLORIDE: 100 mmol/L — AB (ref 101–111)
CO2: 21 mmol/L — AB (ref 22–32)
Calcium: 8.7 mg/dL — ABNORMAL LOW (ref 8.9–10.3)
Creatinine, Ser: 0.82 mg/dL (ref 0.44–1.00)
GFR calc Af Amer: >60 mL/min (ref >60)
GFR calc non Af Amer: >60 mL/min (ref >60)
GLUCOSE: 227 mg/dL — AB (ref 65–99)
Potassium: 3.3 mmol/L — ABNORMAL LOW (ref 3.5–5.1)
SODIUM: 131 mmol/L — AB (ref 135–145)
Total Bilirubin: 1.1 mg/dL (ref 0.3–1.2)
Total Protein: 7.9 g/dL (ref 6.5–8.1)

## 2015-11-28 LAB — RAPID INFLUENZA A&B ANTIGENS
Influenza A (ARMC): NEGATIVE
Influenza B (ARMC): NEGATIVE

## 2015-11-28 LAB — RETICULOCYTES
RBC.: 4.25 MIL/uL (ref 3.80–5.20)
RETIC CT PCT: 2.1 % (ref 0.4–3.1)
Retic Count, Absolute: 89.3 10*3/uL (ref 19.0–183.0)

## 2015-11-28 LAB — POCT PREGNANCY, URINE: Preg Test, Ur: NEGATIVE

## 2015-11-28 LAB — LIPASE, BLOOD: Lipase: 22 U/L (ref 11–51)

## 2015-11-28 MED ORDER — ACETAMINOPHEN 500 MG PO TABS
1000.0000 mg | ORAL_TABLET | Freq: Once | ORAL | Status: AC
  Administered 2015-11-28: 1000 mg via ORAL
  Filled 2015-11-28: qty 2

## 2015-11-28 MED ORDER — SULFAMETHOXAZOLE-TRIMETHOPRIM 800-160 MG PO TABS: 2.0000 | ORAL_TABLET | Freq: Two times a day (BID) | ORAL | Status: DC

## 2015-11-28 MED ORDER — HYDROMORPHONE HCL 1 MG/ML IJ SOLN
1.0000 mg | Freq: Once | INTRAMUSCULAR | Status: AC
  Administered 2015-11-28: 1 mg via INTRAVENOUS
  Filled 2015-11-28: qty 1

## 2015-11-28 MED ORDER — HYDROCODONE-ACETAMINOPHEN 5-325 MG PO TABS: 1.0000 | ORAL_TABLET | Freq: Four times a day (QID) | ORAL | Status: DC | PRN

## 2015-11-28 MED ORDER — ONDANSETRON HCL 4 MG/2ML IJ SOLN
4.0000 mg | Freq: Once | INTRAMUSCULAR | Status: AC
  Administered 2015-11-28: 4 mg via INTRAVENOUS
  Filled 2015-11-28: qty 2

## 2015-11-28 MED ORDER — ONDANSETRON 4 MG PO TBDP: 4.0000 mg | ORAL_TABLET | Freq: Three times a day (TID) | ORAL | Status: DC | PRN

## 2015-11-28 MED ORDER — SODIUM CHLORIDE 0.9 % IV BOLUS (SEPSIS)
1000.0000 mL | Freq: Once | INTRAVENOUS | Status: AC
  Administered 2015-11-28: 1000 mL via INTRAVENOUS

## 2015-11-28 MED ORDER — DEXTROSE 5 % IV SOLN
1.0000 g | INTRAVENOUS | Status: DC
  Administered 2015-11-28: 1 g via INTRAVENOUS
  Filled 2015-11-28: qty 10

## 2015-11-28 MED ORDER — IBUPROFEN 800 MG PO TABS: 800.0000 mg | ORAL_TABLET | Freq: Three times a day (TID) | ORAL | Status: DC | PRN

## 2015-11-28 NOTE — ED Notes (Signed)
Patient presents to Emergency Department via EMS with complaints of co of global pain fever chills. Pt hx of sick cell. Pt states I'm having a pain crisis. Fever 102.3 and HR 118 with EMS. Pain 10/10 global.

## 2015-11-28 NOTE — ED Notes (Signed)
Discharge instructions reviewed with patient. Questions fielded by this RN. Patient verbalizes understanding of instructions. Patient discharged home in stable condition per Sung MD . No acute distress noted at time of discharge.   

## 2015-11-28 NOTE — ED Provider Notes (Signed)
[facility] Emergency Department Provider Note   ____________________________________________  Time seen: Approximately 2:44 AM  I have reviewed the triage vital signs and the nursing notes.   HISTORY  Chief Complaint Sickle Cell Pain Crisis and Fever    HPI Victoria Cortez is a 30 y.o. female who presents to the ED from home EMS with a chief complaintof fever, chills and sickle cell pain crisis. Patient reports onset of symptoms yesterday. History of sickle cell trait and insulin-dependent diabetes. No prior history of acute chest syndrome. Complains of generalized pain, shaking chills, nonproductive cough. Denies nausea, vomiting, diarrhea, dysuria. Denies recent travel or trauma. Nothing makes her symptoms better or worse.   Past Medical History  Diagnosis Date  . Diabetes mellitus without complication (HCC)   . Sickle cell trait [facility](HCC)     Patient Active Problem List   Diagnosis Date Noted  . H/O cesarean section 02/03/2015  . S/P cesarean section 02/03/2015  . Uncontrolled diabetes mellitus (HCC) 01/12/2015  . Labor and delivery, indication for care 01/09/2015  . Pregnancy 11/26/2014    Past Surgical History  Procedure Laterality Date  . Cesarean section    . Cesarean section with bilateral tubal ligation N/A 02/03/2015    Procedure: CESAREAN SECTION WITH BILATERAL TUBAL LIGATION;  Surgeon: Christeen DouglasBethany Beasley, MD;  Location: [facility] ORS;  Service: Obstetrics;  Laterality: N/A;    Current Outpatient Rx  Name  Route  Sig  Dispense  Refill  . insulin detemir (LEVEMIR) 100 UNIT/ML injection   Subcutaneous   Inject 0.16 mLs (16 Units total) into the skin 2 (two) times daily. In evening and morning.   10 mL   11     Allergies Review of patient's allergies indicates no known allergies.  History reviewed. No pertinent family history.  Social History Social History  Substance Use Topics  . Smoking status: Current Every Day Smoker -- 0.50  packs/day  . Smokeless tobacco: Never Used  . Alcohol Use: Yes     Comment: occ    Review of Systems  Constitutional: Positive for fever/chills. Positive for generalized pain. Eyes: No visual changes. ENT: No sore throat. Cardiovascular: Denies chest pain. Respiratory: Denies shortness of breath. Gastrointestinal: No abdominal pain.  No nausea, no vomiting.  No diarrhea.  No constipation. Genitourinary: Negative for dysuria. Musculoskeletal: Negative for back pain. Skin: Negative for rash. Neurological: Negative for headaches, focal weakness or numbness.  10-point ROS otherwise negative.  ____________________________________________   PHYSICAL EXAM:  VITAL SIGNS: ED Triage Vitals  Enc Vitals Group     BP 11/28/15 0106 122/78 mmHg     Pulse Rate 11/28/15 0106 114     Resp 11/28/15 0106 18     Temp 11/28/15 0106 102 F (38.9 C)     Temp Source 11/28/15 0106 Oral     SpO2 11/28/15 0103 98 %     Weight 11/28/15 0106 165 lb (74.844 kg)     Height 11/28/15 0106 5' (1.524 m)     Head Cir --      Peak Flow --      Pain Score 11/28/15 0107 10     Pain Loc --      Pain Edu? --      Excl. in GC? --    Examined after patient received IV Dilaudid: Constitutional: Asleep, easily awakened for exam. Alert and oriented. Well appearing and in no acute distress. Eyes: Conjunctivae are normal. PERRL. EOMI. Head: Atraumatic. Nose: No congestion/rhinnorhea. Mouth/Throat: Mucous  membranes are moist.  Oropharynx non-erythematous. Neck: No stridor.  No neck without meningismus. Cardiovascular: Normal rate, regular rhythm. Grossly normal heart sounds.  Good peripheral circulation. Respiratory: Normal respiratory effort.  No retractions. Lungs CTAB. Nonproductive, loose cough noted. Gastrointestinal: Soft and nontender. No distention. No abdominal bruits. No CVA tenderness. Musculoskeletal: No lower extremity tenderness nor edema.  No joint effusions. Neurologic:  Normal speech and  language. No gross focal neurologic deficits are appreciated. No gait instability. Skin:  Skin is warm, dry and intact. No rash noted. Specifically, no petechiae. Psychiatric: Mood and affect are normal. Speech and behavior are normal.  ____________________________________________   LABS (all labs ordered are listed, but only abnormal results are displayed)  Labs Reviewed  COMPREHENSIVE METABOLIC PANEL - Abnormal; Notable for the following:    Sodium 131 (*)    Potassium 3.3 (*)    Chloride 100 (*)    CO2 21 (*)    Glucose, Bld 227 (*)    Calcium 8.7 (*)    AST 12 (*)    ALT 10 (*)    All other components within normal limits  CBC WITH DIFFERENTIAL/PLATELET - Abnormal; Notable for the following:    WBC 18.9 (*)    RDW 18.2 (*)    Neutro Abs 15.8 (*)    Monocytes Absolute 1.2 (*)    All other components within normal limits  URINALYSIS COMPLETEWITH MICROSCOPIC ([facility] ONLY) - Abnormal; Notable for the following:    Color, Urine YELLOW (*)    APPearance CLOUDY (*)    Glucose, UA 150 (*)    Ketones, ur TRACE (*)    Hgb urine dipstick 1+ (*)    Protein, ur 30 (*)    Leukocytes, UA 3+ (*)    Bacteria, UA MANY (*)    Squamous Epithelial / LPF 0-5 (*)    All other components within normal limits  RAPID INFLUENZA A&B ANTIGENS ([facility] ONLY)  CULTURE, BLOOD (ROUTINE X 2)  CULTURE, BLOOD (ROUTINE X 2)  URINE CULTURE  LIPASE, BLOOD  RETICULOCYTES  POC URINE PREG, ED  POCT PREGNANCY, URINE   ____________________________________________  EKG  ED ECG REPORT I, Kyson Kupper J, the attending physician, personally viewed and interpreted this ECG.   Date: 11/28/2015  EKG Time: 0110  Rate: 111  Rhythm: sinus tachycardia  Axis: Normal  Intervals:none  ST&T Change: Nonspecific  ____________________________________________  RADIOLOGY  Chest 2 view (viewed by me, interpreted per Dr. Manus Gunning): Right upper lobe and perihilar scarring. No acute  process. ____________________________________________   PROCEDURES  Procedure(s) performed: None  Critical Care performed: No  ____________________________________________   INITIAL IMPRESSION / ASSESSMENT AND PLAN / ED COURSE  Pertinent labs & imaging results that were available during my care of the patient were reviewed by me and considered in my medical decision making (see chart for details).  30 year old female with a history of insulin diabetes and sickle cell trait who presents with fever and generalized pain. She is currently resting comfortably after demonstration of IV Dilaudid. Awaiting urinalysis. Leukocytosis noted.  ----------------------------------------- 5:21 AM on 11/28/2015 -----------------------------------------  Patient improved. Afebrile. Normotensive. IV antibiotic completed. Will continue on oral antibiotic and close follow-up with PCP. Strict return precautions given. Patient verbalizes understanding and agrees with plan of care. ____________________________________________   FINAL CLINICAL IMPRESSION(S) / ED DIAGNOSES  Final diagnoses:  Fever, unspecified fever cause  Pyelonephritis  Generalized pain      NEW MEDICATIONS STARTED DURING THIS VISIT:  New Prescriptions   No medications on file  Note:  This document was prepared using Dragon voice recognition software and may include unintentional dictation errors.    Irean Hong, MD 11/28/15 [phone]

## 2015-11-28 NOTE — Discharge Instructions (Signed)
1. Take antibiotic as prescribed (Septra DS twice daily x 7 days). 2. Alternate tylenol and Motrin every 4 hours as needed for fever > 100.47F. 3. You may take pain and nausea medicine as needed (Norco/Zofran). 4. Return to the ER for worsening symptoms, persistent vomiting, difficulty breathing or other concerns.  Fever, Adult A fever is an increase in the body's temperature. It is usually defined as a temperature of 100F (38C) or higher. Brief mild or moderate fevers generally have no long-term effects, and they often do not require treatment. Moderate or high fevers may make you feel uncomfortable and can sometimes be a sign of a serious illness or disease. The sweating that may occur with repeated or prolonged fever may also cause dehydration. Fever is confirmed by taking a temperature with a thermometer. A measured temperature can vary with:  Age.  Time of day.  Location of the thermometer:  Mouth (oral).  Rectum (rectal).  Ear (tympanic).  Underarm (axillary).  Forehead (temporal). HOME CARE INSTRUCTIONS Pay attention to any changes in your symptoms. Take these actions to help with your condition:  Take over-the counter and prescription medicines only as told by your health care provider. Follow the dosing instructions carefully.  If you were prescribed an antibiotic medicine, take it as told by your health care provider. Do not stop taking the antibiotic even if you start to feel better.  Rest as needed.  Drink enough fluid to keep your urine clear or pale yellow. This helps to prevent dehydration.  Sponge yourself or bathe with room-temperature water to help reduce your body temperature as needed. Do not use ice water.  Do not overbundle yourself in blankets or heavy clothes. SEEK MEDICAL CARE IF:  You vomit.  You cannot eat or drink without vomiting.  You have diarrhea.  You have pain when you urinate.  Your symptoms do not improve with treatment.  You  develop new symptoms.  You develop excessive weakness. SEEK IMMEDIATE MEDICAL CARE IF:  You have shortness of breath or have trouble breathing.  You are dizzy or you faint.  You are disoriented or confused.  You develop signs of dehydration, such as a dry mouth, decreased urination, or paleness.  You develop severe pain in your abdomen.  You have persistent vomiting or diarrhea.  You develop a skin rash.  Your symptoms suddenly get worse.   This information is not intended to replace advice given to you by your health care provider. Make sure you discuss any questions you have with your health care provider.   Document Released: 12/11/2000 Document Revised: 03/08/2015 Document Reviewed: 08/11/2014 Elsevier Interactive Patient Education 2016 Else[employer] Inc.  Pyelonephritis, Adult Pyelonephritis is a kidney infection. The kidneys are the organs that filter a person's blood and move waste out of the bloodstream and into the urine. Urine passes from the kidneys, through the ureters, and into the bladder. There are two main types of pyelonephritis:  Infections that come on quickly without any warning (acute pyelonephritis).  Infections that last for a long period of time (chronic pyelonephritis). In most cases, the infection clears up with treatment and does not cause further problems. More severe infections or chronic infections can sometimes spread to the bloodstream or lead to other problems with the kidneys. CAUSES This condition is usually caused by:  Bacteria traveling from the bladder to the kidney through infected urine. The urine in the bladder can become infected with bacteria from:  Bladder infection (cystitis).  Inflammation of the prostate  gland (prostatitis).  Sexual intercourse, in females.  Bacteria traveling from the bloodstream to the kidney. RISK FACTORS This condition is more likely to develop in:  Pregnant women.  Older people.  People who have  diabetes.  People who have kidney stones or bladder stones.  People who have other abnormalities of the kidney or ureter.  People who have a catheter placed in the bladder.  People who have cancer.  People who are sexually active.  Women who use spermicides.  People who have had a prior urinary tract infection. SYMPTOMS Symptoms of this condition include:  Frequent urination.  Strong or persistent urge to urinate.  Burning or stinging when urinating.  Abdominal pain.  Back pain.  Pain in the side or flank area.  Fever.  Chills.  Blood in the urine, or dark urine.  Nausea.  Vomiting. DIAGNOSIS This condition may be diagnosed based on:  Medical history and physical exam.  Urine tests.  Blood tests. You may also have imaging tests of the kidneys, such as an ultrasound or CT scan. TREATMENT Treatment for this condition may depend on the severity of the infection.  If the infection is mild and is found early, you may be treated with antibiotic medicines taken by mouth. You will need to drink fluids to remain hydrated.  If the infection is more severe, you may need to stay in the hospital and receive antibiotics given directly into a vein through an IV tube. You may also need to receive fluids through an IV tube if you are not able to remain hydrated. After your hospital stay, you may need to take oral antibiotics for a period of time. Other treatments may be required, depending on the cause of the infection. HOME CARE INSTRUCTIONS Medicines  Take over-the-counter and prescription medicines only as told by your health care provider.  If you were prescribed an antibiotic medicine, take it as told by your health care provider. Do not stop taking the antibiotic even if you start to feel better. General Instructions  Drink enough fluid to keep your urine clear or pale yellow.  Avoid caffeine, tea, and carbonated beverages. They tend to irritate the  bladder.  Urinate often. Avoid holding in urine for long periods of time.  Urinate before and after sex.  After a bowel movement, women should cleanse from front to back. Use each tissue only once.  Keep all follow-up visits as told by your health care provider. This is important. SEEK MEDICAL CARE IF:  Your symptoms do not get better after 2 days of treatment.  Your symptoms get worse.  You have a fever. SEEK IMMEDIATE MEDICAL CARE IF:  You are unable to take your antibiotics or fluids.  You have shaking chills.  You vomit.  You have severe flank or back pain.  You have extreme weakness or fainting.   This information is not intended to replace advice given to you by your health care provider. Make sure you discuss any questions you have with your health care provider.   Document Released: 06/17/2005 Document Revised: 03/08/2015 Document Reviewed: 10/10/2014 Elsevier Interactive Patient Education [employer]

## 2015-11-29 ENCOUNTER — Encounter: Payer: Self-pay | Admitting: Medical Oncology

## 2015-11-29 ENCOUNTER — Inpatient Hospital Stay
Admission: EM | Admit: 2015-11-29 | Discharge: 2015-11-30 | DRG: 872 | Disposition: A | Discharge status: Home / Self Care | Payer: Medicaid Other | Attending: Internal Medicine | Admitting: Internal Medicine

## 2015-11-29 DIAGNOSIS — Y92239 Unspecified place in hospital as the place of occurrence of the external cause: Secondary | ICD-10-CM | POA: Diagnosis not present

## 2015-11-29 DIAGNOSIS — T361X5A Adverse effect of cephalosporins and other beta-lactam antibiotics, initial encounter: Secondary | ICD-10-CM | POA: Diagnosis not present

## 2015-11-29 DIAGNOSIS — N12 Tubulo-interstitial nephritis, not specified as acute or chronic: Secondary | ICD-10-CM | POA: Diagnosis present

## 2015-11-29 DIAGNOSIS — B961 Klebsiella pneumoniae [K. pneumoniae] as the cause of diseases classified elsewhere: Secondary | ICD-10-CM

## 2015-11-29 DIAGNOSIS — D573 Sickle-cell trait: Secondary | ICD-10-CM | POA: Diagnosis present

## 2015-11-29 DIAGNOSIS — R7881 Bacteremia: Secondary | ICD-10-CM | POA: Diagnosis present

## 2015-11-29 DIAGNOSIS — A4189 Other specified sepsis: Secondary | ICD-10-CM | POA: Diagnosis not present

## 2015-11-29 DIAGNOSIS — E876 Hypokalemia: Secondary | ICD-10-CM | POA: Diagnosis present

## 2015-11-29 DIAGNOSIS — L270 Generalized skin eruption due to drugs and medicaments taken internally: Secondary | ICD-10-CM | POA: Diagnosis not present

## 2015-11-29 DIAGNOSIS — Z888 Allergy status to other drugs, medicaments and biological substances status: Secondary | ICD-10-CM | POA: Diagnosis not present

## 2015-11-29 DIAGNOSIS — A498 Other bacterial infections of unspecified site: Secondary | ICD-10-CM

## 2015-11-29 DIAGNOSIS — E1165 Type 2 diabetes mellitus with hyperglycemia: Secondary | ICD-10-CM | POA: Diagnosis present

## 2015-11-29 DIAGNOSIS — Z794 Long term (current) use of insulin: Secondary | ICD-10-CM | POA: Diagnosis not present

## 2015-11-29 LAB — BLOOD CULTURE ID PANEL (REFLEXED)
Acinetobacter baumannii: NOT DETECTED
CANDIDA ALBICANS: NOT DETECTED
CANDIDA GLABRATA: NOT DETECTED
CANDIDA PARAPSILOSIS: NOT DETECTED
CANDIDA TROPICALIS: NOT DETECTED
Candida krusei: NOT DETECTED
Carbapenem resistance: NOT DETECTED
ENTEROBACTER CLOACAE COMPLEX: NOT DETECTED
ENTEROCOCCUS SPECIES: NOT DETECTED
ESCHERICHIA COLI: NOT DETECTED
Enterobacteriaceae species: DETECTED — AB
Haemophilus influenzae: NOT DETECTED
KLEBSIELLA PNEUMONIAE: DETECTED — AB
Klebsiella oxytoca: NOT DETECTED
Listeria monocytogenes: NOT DETECTED
Methicillin resistance: NOT DETECTED
Neisseria meningitidis: NOT DETECTED
PROTEUS SPECIES: NOT DETECTED
Pseudomonas aeruginosa: NOT DETECTED
STREPTOCOCCUS PNEUMONIAE: NOT DETECTED
Serratia marcescens: NOT DETECTED
Staphylococcus aureus (BCID): NOT DETECTED
Staphylococcus species: NOT DETECTED
Streptococcus agalactiae: NOT DETECTED
Streptococcus pyogenes: NOT DETECTED
Streptococcus species: NOT DETECTED
VANCOMYCIN RESISTANCE: NOT DETECTED

## 2015-11-29 LAB — CBC
HCT: 33.6 % — ABNORMAL LOW (ref 35.0–47.0)
HEMOGLOBIN: 11.4 g/dL — AB (ref 12.0–16.0)
MCH: 28.6 pg (ref 26.0–34.0)
MCHC: 33.9 g/dL (ref 32.0–36.0)
MCV: 84.3 fL (ref 80.0–100.0)
Platelets: 193 10*3/uL (ref 150–440)
RBC: 3.98 MIL/uL (ref 3.80–5.20)
RDW: 17.4 % — ABNORMAL HIGH (ref 11.5–14.5)
WBC: 11.6 10*3/uL — ABNORMAL HIGH (ref 3.6–11.0)

## 2015-11-29 LAB — BASIC METABOLIC PANEL
ANION GAP: 9 (ref 5–15)
BUN: 11 mg/dL (ref 6–20)
CALCIUM: 8.4 mg/dL — AB (ref 8.9–10.3)
CO2: 22 mmol/L (ref 22–32)
Chloride: 102 mmol/L (ref 101–111)
Creatinine, Ser: 1.05 mg/dL — ABNORMAL HIGH (ref 0.44–1.00)
Glucose, Bld: 271 mg/dL — ABNORMAL HIGH (ref 65–99)
Potassium: 3.2 mmol/L — ABNORMAL LOW (ref 3.5–5.1)
Sodium: 133 mmol/L — ABNORMAL LOW (ref 135–145)

## 2015-11-29 LAB — LACTIC ACID, PLASMA: Lactic Acid, Venous: 0.6 mmol/L (ref 0.5–2.0)

## 2015-11-29 LAB — GLUCOSE, CAPILLARY
GLUCOSE-CAPILLARY: 161 mg/dL — AB (ref 65–99)
Glucose-Capillary: 301 mg/dL — ABNORMAL HIGH (ref 65–99)
Glucose-Capillary: 139 mg/dL — ABNORMAL HIGH (ref 65–99)

## 2015-11-29 MED ORDER — IBUPROFEN 400 MG PO TABS: 800.0000 mg | ORAL_TABLET | Freq: Three times a day (TID) | ORAL | Status: DC | PRN

## 2015-11-29 MED ORDER — CIPROFLOXACIN IN D5W 400 MG/200ML IV SOLN
500.0000 mg | Freq: Two times a day (BID) | INTRAVENOUS | Status: DC
  Filled 2015-11-29: qty 250

## 2015-11-29 MED ORDER — POTASSIUM CHLORIDE CRYS ER 20 MEQ PO TBCR
40.0000 meq | EXTENDED_RELEASE_TABLET | ORAL | Status: AC
  Administered 2015-11-29 (×2): 40 meq via ORAL
  Filled 2015-11-29: qty 2

## 2015-11-29 MED ORDER — DIPHENHYDRAMINE HCL 50 MG/ML IJ SOLN
INTRAMUSCULAR | Status: AC
  Administered 2015-11-29: 25 mg via INTRAVENOUS
  Filled 2015-11-29: qty 1

## 2015-11-29 MED ORDER — DEXTROSE 5 % IV SOLN
1.0000 g | INTRAVENOUS | Status: DC
  Administered 2015-11-29: 1 g via INTRAVENOUS

## 2015-11-29 MED ORDER — ONDANSETRON HCL 4 MG PO TABS: 4.0000 mg | ORAL_TABLET | Freq: Four times a day (QID) | ORAL | Status: DC | PRN

## 2015-11-29 MED ORDER — SODIUM CHLORIDE 0.9 % IV BOLUS (SEPSIS)
1000.0000 mL | Freq: Once | INTRAVENOUS | Status: AC
  Administered 2015-11-29: 1000 mL via INTRAVENOUS

## 2015-11-29 MED ORDER — DEXTROSE 5 % IV SOLN: 2.0000 g | INTRAVENOUS | Status: DC

## 2015-11-29 MED ORDER — ACETAMINOPHEN 650 MG RE SUPP: 650.0000 mg | Freq: Four times a day (QID) | RECTAL | Status: DC | PRN

## 2015-11-29 MED ORDER — INSULIN DETEMIR 100 UNIT/ML ~~LOC~~ SOLN
16.0000 [IU] | Freq: Two times a day (BID) | SUBCUTANEOUS | Status: DC
  Administered 2015-11-29 (×2): 16 [IU] via SUBCUTANEOUS
  Filled 2015-11-29 (×4): qty 0.16

## 2015-11-29 MED ORDER — ACETAMINOPHEN 325 MG PO TABS: 650.0000 mg | ORAL_TABLET | Freq: Four times a day (QID) | ORAL | Status: DC | PRN

## 2015-11-29 MED ORDER — DEXTROSE 5 % IV SOLN
1.0000 g | Freq: Once | INTRAVENOUS | Status: AC
  Administered 2015-11-29: 1 g via INTRAVENOUS
  Filled 2015-11-29: qty 10

## 2015-11-29 MED ORDER — ALBUTEROL SULFATE (2.5 MG/3ML) 0.083% IN NEBU
2.5000 mg | INHALATION_SOLUTION | RESPIRATORY_TRACT | Status: DC | PRN
Start: 2015-11-29 — End: 2015-11-30

## 2015-11-29 MED ORDER — ENOXAPARIN SODIUM 40 MG/0.4ML ~~LOC~~ SOLN
40.0000 mg | SUBCUTANEOUS | Status: DC
  Administered 2015-11-29: 40 mg via SUBCUTANEOUS
  Filled 2015-11-29: qty 0.4

## 2015-11-29 MED ORDER — CIPROFLOXACIN IN D5W 400 MG/200ML IV SOLN
400.0000 mg | Freq: Two times a day (BID) | INTRAVENOUS | Status: DC
  Administered 2015-11-29 – 2015-11-30 (×2): 400 mg via INTRAVENOUS
  Filled 2015-11-29 (×4): qty 200

## 2015-11-29 MED ORDER — HYDROCODONE-ACETAMINOPHEN 5-325 MG PO TABS
1.0000 | ORAL_TABLET | Freq: Four times a day (QID) | ORAL | Status: DC | PRN
  Administered 2015-11-29: 1 via ORAL
  Filled 2015-11-29: qty 1

## 2015-11-29 MED ORDER — INSULIN ASPART 100 UNIT/ML ~~LOC~~ SOLN: 0.0000 [IU] | Freq: Every day | SUBCUTANEOUS | Status: DC

## 2015-11-29 MED ORDER — ONDANSETRON HCL 4 MG/2ML IJ SOLN: 4.0000 mg | Freq: Four times a day (QID) | INTRAMUSCULAR | Status: DC | PRN

## 2015-11-29 MED ORDER — DIPHENHYDRAMINE HCL 50 MG/ML IJ SOLN
25.0000 mg | Freq: Once | INTRAMUSCULAR | Status: AC
  Administered 2015-11-29: 25 mg via INTRAVENOUS

## 2015-11-29 MED ORDER — POLYETHYLENE GLYCOL 3350 17 G PO PACK: 17.0000 g | PACK | Freq: Every day | ORAL | Status: DC | PRN

## 2015-11-29 MED ORDER — INSULIN ASPART 100 UNIT/ML ~~LOC~~ SOLN
0.0000 [IU] | Freq: Three times a day (TID) | SUBCUTANEOUS | Status: DC
  Administered 2015-11-29: 11 [IU] via SUBCUTANEOUS
  Administered 2015-11-29: 2 [IU] via SUBCUTANEOUS
  Filled 2015-11-29: qty 2
  Filled 2015-11-29: qty 11

## 2015-11-29 NOTE — ED Provider Notes (Signed)
[facility] Emergency Department Provider Note  ____________________________________________  Time seen: Approximately 9:26 AM  I have reviewed the triage vital signs and the nursing notes.   HISTORY  Chief Complaint Abnormal Lab    HPI Victoria Cortez is a 30 y.o. female presents after being called back for infection in the blood.  Patient reports she is feeling okay, she did have some fevers nausea chills and right flank discomfort over the last 2 days. She has a previous history of kidney infection reports it was similar and was admitted for about a week.  She presently reports no pain in the right flank, and she went back to work today and continued to experience chills nausea and fevers throughout the evening. She reports her symptoms are still ongoing.  She's not had a headache neck stiffness cough trouble breathing abdominal pain except for some discomfort along the right back. No rash.  No history of IV drug use   Past Medical History  Diagnosis Date  . Diabetes mellitus without complication (HCC)   . Sickle cell trait [facility])     Patient Active Problem List   Diagnosis Date Noted  . H/O cesarean section 02/03/2015  . S/P cesarean section 02/03/2015  . Uncontrolled diabetes mellitus (HCC) 01/12/2015  . Labor and delivery, indication for care 01/09/2015  . Pregnancy 11/26/2014    Past Surgical History  Procedure Laterality Date  . Cesarean section    . Cesarean section with bilateral tubal ligation N/A 02/03/2015    Procedure: CESAREAN SECTION WITH BILATERAL TUBAL LIGATION;  Surgeon: Christeen Douglas, MD;  Location: [facility] ORS;  Service: Obstetrics;  Laterality: N/A;    Current Outpatient Rx  Name  Route  Sig  Dispense  Refill  . HYDROcodone-acetaminophen (NORCO) 5-325 MG tablet   Oral   Take 1 tablet by mouth every 6 (six) hours as needed for moderate pain.   15 tablet   0   . ibuprofen (ADVIL,MOTRIN) 800 MG tablet   Oral   Take 1  tablet (800 mg total) by mouth every 8 (eight) hours as needed for moderate pain.   15 tablet   0   . insulin detemir (LEVEMIR) 100 UNIT/ML injection   Subcutaneous   Inject 0.16 mLs (16 Units total) into the skin 2 (two) times daily. In evening and morning.   10 mL   11   . ondansetron (ZOFRAN ODT) 4 MG disintegrating tablet   Oral   Take 1 tablet (4 mg total) by mouth every 8 (eight) hours as needed for nausea or vomiting.   20 tablet   0   . sulfamethoxazole-trimethoprim (BACTRIM DS,SEPTRA DS) 800-160 MG tablet   Oral   Take 2 tablets by mouth 2 (two) times daily.   28 tablet   0     Allergies Review of patient's allergies indicates no known allergies.  No family history on file.  Social History Social History  Substance Use Topics  . Smoking status: Current Every Day Smoker -- 0.50 packs/day  . Smokeless tobacco: Never Used  . Alcohol Use: Yes     Comment: occ    Review of Systems Constitutional: Fevers and chills with fatigue Eyes: No visual changes. ENT: No sore throat. Cardiovascular: Denies chest pain. Respiratory: Denies shortness of breath. She did have a cough but reports had a normal x-ray yesterday and no shortness of breath Gastrointestinal: No abdominal pain.  No nausea, no vomiting.  No diarrhea.  No constipation. Some discomfort in her  right back around her right "kidney". Currently no pain. Genitourinary: Negative for dysuria. Musculoskeletal: See history of present illness Skin: Negative for rash. Neurological: Negative for headaches, focal weakness or numbness.  10-point ROS otherwise negative.  ____________________________________________   PHYSICAL EXAM:  VITAL SIGNS: ED Triage Vitals  Enc Vitals Group     BP 11/29/15 0821 100/60 mmHg     Pulse Rate 11/29/15 0821 93     Resp 11/29/15 0821 18     Temp 11/29/15 0821 98.1 F (36.7 C)     Temp Source 11/29/15 0821 Oral     SpO2 11/29/15 0821 97 %     Weight 11/29/15 0821 165 lb  (74.844 kg)     Height --      Head Cir --      Peak Flow --      Pain Score 11/29/15 0822 0     Pain Loc --      Pain Edu? --      Excl. in GC? --    Constitutional: Alert and oriented. Well appearing and in no acute distress. Eyes: Conjunctivae are normal. PERRL. EOMI. Head: Atraumatic. Nose: No congestion/rhinnorhea. Mouth/Throat: Mucous membranes are moist.  Oropharynx non-erythematous. Neck: No stridor.   Cardiovascular: Normal rate, regular rhythm. Grossly normal heart sounds.  Good peripheral circulation. Respiratory: Normal respiratory effort.  No retractions. Lungs CTAB. Gastrointestinal: Soft and nontender. No distention. No abdominal bruits. Right-sided CVA tenderness, none on the left. Denies any vaginal discharge, pelvic pain or vaginal bleeding. Musculoskeletal: No lower extremity tenderness nor edema.  No joint effusions. Neurologic:  Normal speech and language. No gross focal neurologic deficits are appreciated. Skin:  Skin is warm, dry and intact. No rash noted. Psychiatric: Mood and affect are normal. Speech and behavior are normal.  ____________________________________________   LABS (all labs ordered are listed, but only abnormal results are displayed)  Labs Reviewed  BASIC METABOLIC PANEL - Abnormal; Notable for the following:    Sodium 133 (*)    Potassium 3.2 (*)    Glucose, Bld 271 (*)    Creatinine, Ser 1.05 (*)    Calcium 8.4 (*)    All other components within normal limits  CBC - Abnormal; Notable for the following:    WBC 11.6 (*)    Hemoglobin 11.4 (*)    HCT 33.6 (*)    RDW 17.4 (*)    All other components within normal limits  LACTIC ACID, PLASMA  LACTIC ACID, PLASMA   ____________________________________________  EKG   ____________________________________________  RADIOLOGY   ____________________________________________   PROCEDURES  Procedure(s) performed: None  Critical Care performed:  No  ____________________________________________   INITIAL IMPRESSION / ASSESSMENT AND PLAN / ED COURSE  Pertinent labs & imaging results that were available during my care of the patient were reviewed by me and considered in my medical decision making (see chart for details).  Patient presents for reevaluation due to call back. Blood cultures revealed positive Klebsiella and Enterobacter. We will admit her due to high risk for evaluating and developing sepsis, known bacteremia, need for IV antibiotics and close monitoring to prevent decompensation and worsening infection.  She is presently hemodynamically stable, has no complaint of pain, is afebrile and resting comfortably but does report having systemic symptoms yesterday including fevers, chills, fatigue.  Discussed with Dr. Hilton SinclairWeiting of the hospitalist service, he will see and evaluate the patient for admission and antibiotic selection. ____________________________________________   FINAL CLINICAL IMPRESSION(S) / ED DIAGNOSES  Final diagnoses:  Pyelonephritis  Klebsiella pneumoniae infection  Bacteremia  Enterobacter species bacteremia    Sharyn Creamer, MD 11/29/15 [phone]

## 2015-11-29 NOTE — ED Notes (Signed)
Pt reports that she was seen here yesterday morning and a culture was done and pt was called back to hospital to be admitted for urosepsis. Pt denies sx's at this time.

## 2015-11-29 NOTE — Progress Notes (Signed)
PHARMACY - PHYSICIAN COMMUNICATION CRITICAL VALUE ALERT - BLOOD CULTURE IDENTIFICATION (BCID)  Results for orders placed or performed during the hospital encounter of 11/28/15  Blood Culture ID Panel (Reflexed) (Collected: 11/28/2015  2:06 AM)  Result Value Ref Range   Enterococcus species NOT DETECTED NOT DETECTED   Vancomycin resistance NOT DETECTED NOT DETECTED   Listeria monocytogenes NOT DETECTED NOT DETECTED   Staphylococcus species NOT DETECTED NOT DETECTED   Staphylococcus aureus NOT DETECTED NOT DETECTED   Methicillin resistance NOT DETECTED NOT DETECTED   Streptococcus species NOT DETECTED NOT DETECTED   Streptococcus agalactiae NOT DETECTED NOT DETECTED   Streptococcus pneumoniae NOT DETECTED NOT DETECTED   Streptococcus pyogenes NOT DETECTED NOT DETECTED   Acinetobacter baumannii NOT DETECTED NOT DETECTED   Enterobacteriaceae species DETECTED (A) NOT DETECTED   Enterobacter cloacae complex NOT DETECTED NOT DETECTED   Escherichia coli NOT DETECTED NOT DETECTED   Klebsiella oxytoca NOT DETECTED NOT DETECTED   Klebsiella pneumoniae DETECTED (A) NOT DETECTED   Proteus species NOT DETECTED NOT DETECTED   Serratia marcescens NOT DETECTED NOT DETECTED   Carbapenem resistance NOT DETECTED NOT DETECTED   Haemophilus influenzae NOT DETECTED NOT DETECTED   Neisseria meningitidis NOT DETECTED NOT DETECTED   Pseudomonas aeruginosa NOT DETECTED NOT DETECTED   Candida albicans NOT DETECTED NOT DETECTED   Candida glabrata NOT DETECTED NOT DETECTED   Candida krusei NOT DETECTED NOT DETECTED   Candida parapsilosis NOT DETECTED NOT DETECTED   Candida tropicalis NOT DETECTED NOT DETECTED    Name of physician (or Provider) Contacted: Results called to ED charge nurse since patient has been discharged from ED.  Changes to prescribed antibiotics required: n/a.  Jenessa Gillingham S 11/29/2015  1:46 AM

## 2015-11-29 NOTE — ED Notes (Signed)
Pt resting in bed, eyes closed, resp even, pt on monitor

## 2015-11-29 NOTE — Progress Notes (Signed)
Inpatient Diabetes Program Recommendations  AACE/ADA: New Consensus Statement on Inpatient Glycemic Control (2015)  Target Ranges:  Prepandial:   less than 140 mg/dL      Peak postprandial:   less than 180 mg/dL (1-2 hours)      Critically ill patients:  140 - 180 mg/dL  Results for [name], Victoria M (MRN [id number]) as of 11/29/2015 09:50  Ref. Range 11/28/2015 01:15 11/29/2015 08:44  Glucose Latest Ref Range: 65-99 mg/dL 191227 (H) 478271 (H)   Results for [name], Victoria M (MRN [id number]) as of 11/29/2015 09:50  Ref. Range 11/04/2013 05:29  Hemoglobin A1C Latest Ref Range: 4.2-6.3 % 11.1 (H)   Review of Glycemic Control  Diabetes history: DM2 Outpatient Diabetes medications: Levemir 16 units BID Current orders for Inpatient glycemic control: Levemir 16 units BID, Novolog 0-15 units TID with meals, Novolog 0-5 units QHS  Inpatient Diabetes Program Recommendations: HgbA1C: Last A1C in the chart was 11.1% on 11/04/2013. Please consider ordering an A1C to evaluate glycemic control over the past 2-3 months.  Thanks, Orlando PennerMarie Armina Galloway, RN, MSN, CDE Diabetes Coordinator Inpatient Diabetes Program [phone] (Team Pager from 8am to 5pm) [phone] ([facility] office) [phone] [facility] office) [phone] [facility] office)

## 2015-11-29 NOTE — ED Notes (Signed)
Pt called this RN to room, pt itching and hives visible on abd, IV abx stopped, MD notified, IV benadryl given, lungs clear, MD brought to bedside, hospitalist paged to notify, pt room air 100%, given call bell and instructed to call for any change in condition, will continue to monitor closely

## 2015-11-29 NOTE — H&P (Signed)
[facility]   PATIENT NAME: Victoria Cortez    MR#:  [id number]  DATE OF BIRTH:  [birth year]  DATE OF ADMISSION:  11/29/2015  PRIMARY CARE PHYSICIAN: [facility]   REQUESTING/REFERRING PHYSICIAN: Dr. Fanny Bien  CHIEF COMPLAINT:   Chief Complaint  Patient presents with  . Abnormal Lab   Fever, UTI  HISTORY OF PRESENT ILLNESS:  Victoria Cortez  is a 30 y.o. female with a known history of Sickle cell trait and insulin-dependent diabetes mellitus who was seen in the emergency room yesterday and discharged on Bactrim for UTI returned to ER. She is growing Klebsiella in her blood cultures. Continues to have fever and chills. Is being admitted for IV antibiotics and further workup. Patient did take 1 dose of Bactrim. Mild back pain. Increased urinary frequency. No hematuria or dysuria. No shortness of breath. Some nausea but no vomiting. No abdominal pain. No recent antibiotic use. No allergies. She received 1 dose of ceftriaxone yesterday in the emergency room.  PAST MEDICAL HISTORY:   Past Medical History  Diagnosis Date  . Diabetes mellitus without complication (HCC)   . Sickle cell trait (HCC)     PAST SURGICAL HISTORY:   Past Surgical History  Procedure Laterality Date  . Cesarean section    . Cesarean section with bilateral tubal ligation N/A 02/03/2015    Procedure: CESAREAN SECTION WITH BILATERAL TUBAL LIGATION;  Surgeon: Christeen Douglas, MD;  Location: [facility] ORS;  Service: Obstetrics;  Laterality: N/A;    SOCIAL HISTORY:   Social History  Substance Use Topics  . Smoking status: Current Every Day Smoker -- 0.50 packs/day  . Smokeless tobacco: Never Used  . Alcohol Use: 0.0 oz/week    0 Standard drinks or equivalent per week     Comment: occ    FAMILY HISTORY:   Family History  Problem Relation Age of Onset  . Sickle cell anemia      DRUG ALLERGIES:   Allergies  Allergen Reactions  . Rocephin  [Ceftriaxone]     REVIEW OF SYSTEMS:   Review of Systems  Constitutional: Positive for fever, chills and malaise/fatigue. Negative for weight loss.  HENT: Negative for hearing loss and nosebleeds.   Eyes: Negative for blurred vision, double vision and pain.  Respiratory: Negative for cough, hemoptysis, sputum production, shortness of breath and wheezing.   Cardiovascular: Negative for chest pain, palpitations, orthopnea and leg swelling.  Gastrointestinal: Positive for nausea. Negative for vomiting, abdominal pain, diarrhea and constipation.  Genitourinary: Negative for dysuria and hematuria.  Musculoskeletal: Positive for back pain. Negative for myalgias and falls.  Skin: Negative for rash.  Neurological: Negative for dizziness, tremors, sensory change, speech change, focal weakness, seizures and headaches.  Endo/Heme/Allergies: Does not bruise/bleed easily.  Psychiatric/Behavioral: Negative for depression and memory loss. The patient is not nervous/anxious.     MEDICATIONS AT HOME:   Prior to Admission medications   Medication Sig Start Date End Date Taking? Authorizing Provider  HYDROcodone-acetaminophen (NORCO) 5-325 MG tablet Take 1 tablet by mouth every 6 (six) hours as needed for moderate pain. 11/28/15  Yes Irean Hong, MD  ibuprofen (ADVIL,MOTRIN) 800 MG tablet Take 1 tablet (800 mg total) by mouth every 8 (eight) hours as needed for moderate pain. 11/28/15  Yes Irean Hong, MD  insulin detemir (LEVEMIR) 100 UNIT/ML injection Inject 0.16 mLs (16 Units total) into the skin 2 (two) times daily. In evening and morning. 02/07/15  Yes Christeen Douglas, MD  ondansetron (ZOFRAN ODT) 4 MG disintegrating tablet Take 1 tablet (4 mg total) by mouth every 8 (eight) hours as needed for nausea or vomiting. 11/28/15  Yes Irean HongJade J Sung, MD  sulfamethoxazole-trimethoprim (BACTRIM DS,SEPTRA DS) 800-160 MG tablet Take 2 tablets by mouth 2 (two) times daily. 11/28/15  Yes Irean HongJade J Sung, MD     VITAL SIGNS:   Blood pressure 100/65, pulse 84, temperature 98.1 F (36.7 C), temperature source Oral, resp. rate 18, weight 74.844 kg (165 lb), last menstrual period 10/30/2015, SpO2 99 %, unknown if currently breastfeeding.  PHYSICAL EXAMINATION:  Physical Exam  GENERAL:  30 y.o.-year-old patient lying in the bed with no acute distress.  EYES: Pupils equal, round, reactive to light and accommodation. No scleral icterus. Extraocular muscles intact.  HEENT: Head atraumatic, normocephalic. Oropharynx and nasopharynx clear. No oropharyngeal erythema, moist oral mucosa  NECK:  Supple, no jugular venous distention. No thyroid enlargement, no tenderness.  LUNGS: Normal breath sounds bilaterally, no wheezing, rales, rhonchi. No use of accessory muscles of respiration.  CARDIOVASCULAR: S1, S2 normal. No murmurs, rubs, or gallops.  ABDOMEN: Soft, nontender, nondistended. Bowel sounds present. No organomegaly or mass.  EXTREMITIES: No pedal edema, cyanosis, or clubbing. + 2 pedal & radial pulses b/l.   NEUROLOGIC: Cranial nerves II through XII are intact. No focal Motor or sensory deficits appreciated b/l PSYCHIATRIC: The patient is alert and oriented x 3. Good affect.  SKIN: No obvious rash, lesion, or ulcer.   LABORATORY PANEL:   CBC  Recent Labs Lab 11/29/15 0844  WBC 11.6*  HGB 11.4*  HCT 33.6*  PLT 193   ------------------------------------------------------------------------------------------------------------------  Chemistries   Recent Labs Lab 11/28/15 0115 11/29/15 0844  NA 131* 133*  K 3.3* 3.2*  CL 100* 102  CO2 21* 22  GLUCOSE 227* 271*  BUN 8 11  CREATININE 0.82 1.05*  CALCIUM 8.7* 8.4*  AST 12*  --   ALT 10*  --   ALKPHOS 67  --   BILITOT 1.1  --    ------------------------------------------------------------------------------------------------------------------  Cardiac Enzymes No results for input(s): TROPONINI in the last 168  hours. ------------------------------------------------------------------------------------------------------------------  RADIOLOGY:  Dg Chest 2 View  11/28/2015  CLINICAL DATA:  Fever for 1 day. EXAM: CHEST  2 VIEW COMPARISON:  11/05/2013 FINDINGS: The cardiomediastinal contours are normal. Mild right upper lobe and perihilar scarring is unchanged. No new airspace disease. Pulmonary vasculature is normal. No consolidation, pleural effusion, or pneumothorax. No acute osseous abnormalities are seen. IMPRESSION: Right upper lobe and perihilar scarring.  No acute process. Electronically Signed   By: Rubye OaksMelanie  Ehinger M.D.   On: 11/28/2015 01:39     IMPRESSION AND PLAN:   * Klebsiella bacteremia with UTI and sepsis Patient was discharged home on oral Bactrim yesterday. Change to IV ceftriaxone. But patient had rash. IV Benadryl given. Rash resolved. We will switch it to IVs ciprofloxacin. Wait for final sensitivities. Monitor WBC. Fluid bolus given.  * Uncontrolled diabetes mellitus Restart home dose of insulin. Uncontrolled likely due to sepsis. Sliding scale insulin.  * Hypokalemia Replace orally.  * DVT prophylaxis with Lovenox  All the records are reviewed and case discussed with ED provider. Management plans discussed with the patient, family and they are in agreement.  CODE STATUS: FULL CODE  TOTAL TIME TAKING CARE OF THIS PATIENT: 45 minutes.   Milagros LollSudini, Barlow Harrison R M.D on 11/29/2015 at 12:31 PM  Between 7am to 6pm - Pager - [phone]  After 6pm go to [email or web address] - password [email or web address]  [facility]  Office  [phone]  CC: Primary care physician; [facility]  Note: This dictation was prepared with Dragon dictation along with smaller phrase technology. Any transcriptional errors that result from this process are unintentional.

## 2015-11-29 NOTE — ED Provider Notes (Signed)
-----------------------------------------   1:43 AM on 11/29/2015 -----------------------------------------  Lab notified charge nurse of positive blood culture for Enterobacter and Klebsiella. Charge nurse Raquel called both of the patient's phone numbers which are listed in her chart (home and cell); one was disconnected. The other one went straight to voicemail; charge nurse left a message for patient to call back immediately. Patient requires hospitalization with IV antibiotics for bacteremia.   ----------------------------------------- 1:54 AM on 11/29/2015 -----------------------------------------  Discussed with associated medical director Dr. Manson PasseyBrown who agrees with sending police to patient's house to inform her of positive blood culture and need for hospitalization. Charge nurse to coordinate.  ----------------------------------------- 4:39 AM on 11/29/2015 -----------------------------------------  I am told the police went to patient's residence. She was not there but her grandmother was. Grandmother states she will pass along the message for patient to call the ED.  ----------------------------------------- 6:38 AM on 11/29/2015 -----------------------------------------  Patient called the ED. I spoke with her directly and instructed her to return to the ED for hospital admission for bacteremia and to receive IV antibiotics. She states she is feeling somewhat better; still having fevers on and off. Daytime charge nurse Greg aware of patient and expected plan of care.  Irean HongJade J Sung, MD 11/29/15 [phone]

## 2015-11-30 LAB — BASIC METABOLIC PANEL
Anion gap: 5 (ref 5–15)
BUN: 13 mg/dL (ref 6–20)
CHLORIDE: 110 mmol/L (ref 101–111)
CO2: 23 mmol/L (ref 22–32)
CREATININE: 1 mg/dL (ref 0.44–1.00)
Calcium: 8.4 mg/dL — ABNORMAL LOW (ref 8.9–10.3)
GFR calc Af Amer: >60 mL/min (ref >60)
GFR calc non Af Amer: >60 mL/min (ref >60)
GLUCOSE: 108 mg/dL — AB (ref 65–99)
POTASSIUM: 3.9 mmol/L (ref 3.5–5.1)
SODIUM: 138 mmol/L (ref 135–145)

## 2015-11-30 LAB — URINE CULTURE: SPECIAL REQUESTS: NORMAL

## 2015-11-30 LAB — CBC
HCT: 31.6 % — ABNORMAL LOW (ref 35.0–47.0)
Hemoglobin: 10.7 g/dL — ABNORMAL LOW (ref 12.0–16.0)
MCH: 28.6 pg (ref 26.0–34.0)
MCHC: 33.8 g/dL (ref 32.0–36.0)
MCV: 84.7 fL (ref 80.0–100.0)
Platelets: 184 10*3/uL (ref 150–440)
RBC: 3.73 MIL/uL — ABNORMAL LOW (ref 3.80–5.20)
RDW: 18.1 % — AB (ref 11.5–14.5)
WBC: 9 10*3/uL (ref 3.6–11.0)

## 2015-11-30 LAB — GLUCOSE, CAPILLARY: GLUCOSE-CAPILLARY: 107 mg/dL — AB (ref 65–99)

## 2015-11-30 MED ORDER — CIPROFLOXACIN HCL 500 MG PO TABS: 500.0000 mg | ORAL_TABLET | Freq: Two times a day (BID) | ORAL | Status: DC

## 2015-11-30 NOTE — Progress Notes (Signed)
Discussed discharge instructions and medications with pt.  IV removed. No questions at this time.  Pt transported home via car by family.  Danielle Holman Bonsignore, RN 

## 2015-11-30 NOTE — Discharge Summary (Signed)
[facility]   PATIENT NAME: Victoria Cortez    MR#:  [id number]  DATE OF BIRTH:  01-03-1986  DATE OF ADMISSION:  11/29/2015 ADMITTING PHYSICIAN: Milagros Loll, MD  DATE OF DISCHARGE: 11/30/2015 10:45 AM  PRIMARY CARE PHYSICIAN: [facility]   ADMISSION DIAGNOSIS:  Bacteremia [R78.81] Pyelonephritis [N12] Klebsiella pneumoniae infection [B96.1]  DISCHARGE DIAGNOSIS:  Active Problems:   Bacteremia   SECONDARY DIAGNOSIS:   Past Medical History  Diagnosis Date  . Diabetes mellitus without complication (HCC)   . Sickle cell trait (HCC)      ADMITTING HISTORY  Victoria Cortez is a 30 y.o. female with a known history of Sickle cell trait and insulin-dependent diabetes mellitus who was seen in the emergency room yesterday and discharged on Bactrim for UTI returned to ER. She is growing Klebsiella in her blood cultures. Continues to have fever and chills. Is being admitted for IV antibiotics and further workup. Patient did take 1 dose of Bactrim. Mild back pain. Increased urinary frequency. No hematuria or dysuria. No shortness of breath. Some nausea but no vomiting. No abdominal pain. No recent antibiotic use. No allergies. She received 1 dose of ceftriaxone yesterday in the emergency room.   HOSPITAL COURSE:   * Klebsiella pneumonia bacteremia with UTI and sepsis Patient was found to have bacteremia after being discharged from emergency room. She returned. Was admitted for IV antibiotics. Initially started on IV ceftriaxone with which she had a rash. Later switched to IV ciprofloxacin. Cultures show Klebsiella sensitive to ciprofloxacin. She is being discharged on oral ciprofloxacin for 12 days to finish a total two-week course. She is afebrile and WBC count has returned to normal. No abdominal pain, dysuria or frequency.  * Insulin Dependent diabetes mellitus Continue home medications  Patient is stable to be  discharged home. 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  CONSULTS OBTAINED:     DRUG ALLERGIES:   Allergies  Allergen Reactions  . Rocephin [Ceftriaxone]     DISCHARGE MEDICATIONS:   Discharge Medication List as of 11/30/2015 10:03 AM    CONTINUE these medications which have CHANGED   Details  ciprofloxacin (CIPRO) 500 MG tablet Take 1 tablet (500 mg total) by mouth 2 (two) times daily., Starting 11/30/2015, Until Discontinued, Normal      CONTINUE these medications which have NOT CHANGED   Details  HYDROcodone-acetaminophen (NORCO) 5-325 MG tablet Take 1 tablet by mouth every 6 (six) hours as needed for moderate pain., Starting 11/28/2015, Until Discontinued, Print    ibuprofen (ADVIL,MOTRIN) 800 MG tablet Take 1 tablet (800 mg total) by mouth every 8 (eight) hours as needed for moderate pain., Starting 11/28/2015, Until Discontinued, Print    insulin detemir (LEVEMIR) 100 UNIT/ML injection Inject 0.16 mLs (16 Units total) into the skin 2 (two) times daily. In evening and morning., Starting 02/07/2015, Until Discontinued, Normal    ondansetron (ZOFRAN ODT) 4 MG disintegrating tablet Take 1 tablet (4 mg total) by mouth every 8 (eight) hours as needed for nausea or vomiting., Starting 11/28/2015, Until Discontinued, Print      STOP taking these medications     sulfamethoxazole-trimethoprim (BACTRIM DS,SEPTRA DS) 800-160 MG tablet         Today   VITAL SIGNS:  Blood pressure 105/53, pulse 85, temperature 98.1 F (36.7 C), temperature source Oral, resp. rate 28, height 5' (1.524 m), weight 74.844 kg (165 lb), last menstrual period 10/30/2015, SpO2 100 %, unknown  if currently breastfeeding.  I/O:   Intake/Output Summary (Last 24 hours) at 11/30/15 1340 Last data filed at 11/29/15 1700  Gross per 24 hour  Intake    240 ml  Output      0 ml  Net    240 ml    PHYSICAL EXAMINATION:  Physical Exam  GENERAL:  30 y.o.-year-old patient lying in the bed with no  acute distress.  LUNGS: Normal breath sounds bilaterally, no wheezing, rales,rhonchi or crepitation. No use of accessory muscles of respiration.  CARDIOVASCULAR: S1, S2 normal. No murmurs, rubs, or gallops.  ABDOMEN: Soft, non-tender, non-distended. Bowel sounds present. No organomegaly or mass.  NEUROLOGIC: Moves all 4 extremities. PSYCHIATRIC: The patient is alert and oriented x 3.  SKIN: No obvious rash, lesion, or ulcer.   DATA REVIEW:   CBC  Recent Labs Lab 11/30/15 0520  WBC 9.0  HGB 10.7*  HCT 31.6*  PLT 184    Chemistries   Recent Labs Lab 11/28/15 0115  11/30/15 0520  NA 131*  < > 138  K 3.3*  < > 3.9  CL 100*  < > 110  CO2 21*  < > 23  GLUCOSE 227*  < > 108*  BUN 8  < > 13  CREATININE 0.82  < > 1.00  CALCIUM 8.7*  < > 8.4*  AST 12*  --   --   ALT 10*  --   --   ALKPHOS 67  --   --   BILITOT 1.1  --   --   < > = values in this interval not displayed.  Cardiac Enzymes No results for input(s): TROPONINI in the last 168 hours.  Microbiology Results  Results for orders placed or performed during the hospital encounter of 11/28/15  Culture, blood (routine x 2)     Status: None (Preliminary result)   Collection Time: 11/28/15  2:00 AM  Result Value Ref Range Status   Specimen Description BLOOD RIGHT ANTECUBITAL  Final   Special Requests BOTTLES DRAWN AEROBIC AND ANAEROBIC 20ML  Final   Culture NO GROWTH 1 DAY  Final   Report Status PENDING  Incomplete  Culture, blood (routine x 2)     Status: Abnormal (Preliminary result)   Collection Time: 11/28/15  2:06 AM  Result Value Ref Range Status   Specimen Description BLOOD LEFT ANTECUBITAL  Final   Special Requests BOTTLES DRAWN AEROBIC AND ANAEROBIC 5ML  Final   Culture  Setup Time   Final    GRAM NEGATIVE RODS ANAEROBIC BOTTLE ONLY CRITICAL RESULT CALLED TO, READ BACK BY AND VERIFIED WITH: MATT MCBANE @ 0030 ON 11/29/2015 BY CAF CONFIRMED BY TLB    Culture KLEBSIELLA PNEUMONIAE ANAEROBIC BOTTLE ONLY   (A)  Final   Report Status PENDING  Incomplete   Organism ID, Bacteria KLEBSIELLA PNEUMONIAE  Final      Susceptibility   Klebsiella pneumoniae - MIC*    AMPICILLIN Value in next row Resistant      RESISTANT>=32    CEFAZOLIN Value in next row Sensitive      SENSITIVE<=4    CEFEPIME Value in next row Sensitive      SENSITIVE<=1    CEFTAZIDIME Value in next row Sensitive      SENSITIVE<=1    CEFTRIAXONE Value in next row Sensitive      SENSITIVE<=1    CIPROFLOXACIN Value in next row Sensitive      SENSITIVE<=0.25    GENTAMICIN Value in next row Sensitive  SENSITIVE<=1    IMIPENEM Value in next row Sensitive      SENSITIVE<=0.25    TRIMETH/SULFA Value in next row Sensitive      SENSITIVE<=20    AMPICILLIN/SULBACTAM Value in next row Sensitive      SENSITIVE4    PIP/TAZO Value in next row Sensitive      SENSITIVE<=4    Extended ESBL Value in next row Sensitive      SENSITIVE<=4    * KLEBSIELLA PNEUMONIAE  Blood Culture ID Panel (Reflexed)     Status: Abnormal   Collection Time: 11/28/15  2:06 AM  Result Value Ref Range Status   Enterococcus species NOT DETECTED NOT DETECTED Final   Vancomycin resistance NOT DETECTED NOT DETECTED Final   Listeria monocytogenes NOT DETECTED NOT DETECTED Final   Staphylococcus species NOT DETECTED NOT DETECTED Final   Staphylococcus aureus NOT DETECTED NOT DETECTED Final   Methicillin resistance NOT DETECTED NOT DETECTED Final   Streptococcus species NOT DETECTED NOT DETECTED Final   Streptococcus agalactiae NOT DETECTED NOT DETECTED Final   Streptococcus pneumoniae NOT DETECTED NOT DETECTED Final   Streptococcus pyogenes NOT DETECTED NOT DETECTED Final   Acinetobacter baumannii NOT DETECTED NOT DETECTED Final   Enterobacteriaceae species DETECTED (A) NOT DETECTED Final    Comment: CRITICAL RESULT CALLED TO, READ BACK BY AND VERIFIED WITH: MATT MCBANE @ 0030 ON 11/29/2015 BY CAF    Enterobacter cloacae complex NOT DETECTED NOT DETECTED  Final   Escherichia coli NOT DETECTED NOT DETECTED Final   Klebsiella oxytoca NOT DETECTED NOT DETECTED Final   Klebsiella pneumoniae DETECTED (A) NOT DETECTED Final    Comment: CRITICAL RESULT CALLED TO, READ BACK BY AND VERIFIED WITH: MATT MCBANE @ 0030 ON 11/29/2015 BY CAF    Proteus species NOT DETECTED NOT DETECTED Final   Serratia marcescens NOT DETECTED NOT DETECTED Final   Carbapenem resistance NOT DETECTED NOT DETECTED Final   Haemophilus influenzae NOT DETECTED NOT DETECTED Final   Neisseria meningitidis NOT DETECTED NOT DETECTED Final   Pseudomonas aeruginosa NOT DETECTED NOT DETECTED Final   Candida albicans NOT DETECTED NOT DETECTED Final   Candida glabrata NOT DETECTED NOT DETECTED Final   Candida krusei NOT DETECTED NOT DETECTED Final   Candida parapsilosis NOT DETECTED NOT DETECTED Final   Candida tropicalis NOT DETECTED NOT DETECTED Final  Urine culture     Status: Abnormal   Collection Time: 11/28/15  2:58 AM  Result Value Ref Range Status   Specimen Description URINE, CLEAN CATCH  Final   Special Requests Normal  Final   Culture >=100,000 COLONIES/mL KLEBSIELLA PNEUMONIAE (A)  Final   Report Status 11/30/2015 FINAL  Final   Organism ID, Bacteria KLEBSIELLA PNEUMONIAE (A)  Final      Susceptibility   Klebsiella pneumoniae - MIC*    AMPICILLIN >=32 RESISTANT Resistant     CEFAZOLIN <=4 SENSITIVE Sensitive     CEFTRIAXONE <=1 SENSITIVE Sensitive     CIPROFLOXACIN <=0.25 SENSITIVE Sensitive     GENTAMICIN <=1 SENSITIVE Sensitive     IMIPENEM <=0.25 SENSITIVE Sensitive     NITROFURANTOIN <=16 SENSITIVE Sensitive     TRIMETH/SULFA <=20 SENSITIVE Sensitive     AMPICILLIN/SULBACTAM 4 SENSITIVE Sensitive     PIP/TAZO <=4 SENSITIVE Sensitive     Extended ESBL NEGATIVE Sensitive     * >=100,000 COLONIES/mL KLEBSIELLA PNEUMONIAE  Rapid Influenza A&B Antigens ([facility] only)     Status: None   Collection Time: 11/28/15  3:09 AM  Result Value Ref  Range Status   Influenza A  ([facility]) NEGATIVE NEGATIVE Final   Influenza B ([facility]) NEGATIVE NEGATIVE Final    RADIOLOGY:  No results found.  Follow up with PCP in 1 week.  Management plans discussed with the patient, family and they are in agreement.  CODE STATUS:     Code Status Orders        Start     Ordered   11/29/15 0944  Full code   Continuous     11/29/15 0944    Code Status History    Date Active Date Inactive Code Status Order ID Comments User Context   02/03/2015  2:13 PM 02/07/2015  7:41 PM Full Code [id number]  Christeen Douglas, MD Inpatient   01/12/2015 12:31 PM 01/17/2015  1:03 PM Full Code [id number]  Ala Dach, MD Inpatient   01/09/2015  8:53 PM 01/10/2015  2:19 AM Full Code [id number]  Vernie Ammons, RN Inpatient   11/26/2014  6:32 PM 11/27/2014 12:55 AM Full Code [id number]  Argentina Ponder, RN Inpatient      TOTAL TIME TAKING CARE OF THIS PATIENT ON DAY OF DISCHARGE: more than 30 minutes.   Milagros Loll R M.D on 11/30/2015 at 1:40 PM  Between 7am to 6pm - Pager - [phone]  After 6pm go to [email or web address] - password [email or web address] [facility]  [place]  Office  [phone]  CC: Primary care physician; [facility]  Note: This dictation was prepared with Dragon dictation along with smaller phrase technology. Any transcriptional errors that result from this process are unintentional.

## 2015-11-30 NOTE — Discharge Instructions (Signed)
°  DIET:  °Diabetic diet ° °DISCHARGE CONDITION:  °Stable ° °ACTIVITY:  °Activity as tolerated ° °OXYGEN:  °Home Oxygen: No. °  °Oxygen Delivery: room air ° °DISCHARGE LOCATION:  °home  ° °If you experience worsening of your admission symptoms, develop shortness of breath, life threatening emergency, suicidal or homicidal thoughts you must seek medical attention immediately by calling 911 or calling your MD immediately  if symptoms less severe. ° °You Must read complete instructions/literature along with all the possible adverse reactions/side effects for all the Medicines you take and that have been prescribed to you. Take any new Medicines after you have completely understood and accpet all the possible adverse reactions/side effects.  ° °Please note ° °You were cared for by a hospitalist during your hospital stay. If you have any questions about your discharge medications or the care you received while you were in the hospital after you are discharged, you can call the unit and asked to speak with the hospitalist on call if the hospitalist that took care of you is not available. Once you are discharged, your primary care physician will handle any further medical issues. Please note that NO REFILLS for any discharge medications will be authorized once you are discharged, as it is imperative that you return to your primary care physician (or establish a relationship with a primary care physician if you do not have one) for your aftercare needs so that they can reassess your need for medications and monitor your lab values. ° °QUIT SMOKING ° °

## 2015-11-30 NOTE — Progress Notes (Signed)
Pharmacy Antibiotic Note  Victoria Cortez is a 30 y.o. female admitted on 11/29/2015 with bacteremia.  Pharmacy has been consulted for ciprofloxacin dosing.  Patient was seen in ED on 5/30 and discharged on Bactrim. BCID resulted positive for Klebsiella pneumo and patient was admitted for IV antibiotics. Initially started on ceftriaxone 2 g IV daily but patient developed hives and was switched to ciprofloxacin.  Sensitivies show the bacteria is sensitive to ciprofloxacin. This is day #2 of antibiotics. Patient also has GNR growing in urine culture.   Plan: Continue ciprofloxacin 400 mg IV q12h  Height: 5' (152.4 cm) Weight: 165 lb (74.844 kg) IBW/kg (Calculated) : 45.5  Temp (24hrs), Avg:98.6 F (37 C), Min:97.7 F (36.5 C), Max:100 F (37.8 C)   Recent Labs Lab 11/28/15 0115 11/29/15 0844 11/29/15 0913 11/30/15 0520  WBC 18.9* 11.6*  --  9.0  CREATININE 0.82 1.05*  --  1.00  LATICACIDVEN  --   --  0.6  --     Estimated Creatinine Clearance: 75.6 mL/min (by C-G formula based on Cr of 1).    Allergies  Allergen Reactions  . Rocephin [Ceftriaxone]    Antimicrobials this admission: ciprofloxacin 5/31 >>  ceftriaxone 5/31 >> 5/31  Dose adjustments this admission:  Microbiology results: 5/30 BCx: Klebsiella pneumoniae 5/30 UCx: <100K GNR, sensitivies pending   Thank you for allowing pharmacy to be a part of this patient's care.  Victoria Cortez, PharmD Clinical Pharmacist 11/30/2015 8:23 AM

## 2015-11-30 NOTE — Progress Notes (Signed)
[facility]         [place]   11/30/2015  Patient: Victoria Cortez   Date of Birth:  1985-11-09  Date of admission:  11/29/2015  Date of Discharge  11/30/2015    To Whom it May Concern:   Victoria Cortez  may return to work on 12/01/2015.  PHYSICAL ACTIVITY:  Full  If you have any questions or concerns, please don't hesitate to call.  Sincerely,   Milagros LollSudini, Victoria Cortez R M.D Office : [phone]   .

## 2015-12-03 LAB — CULTURE, BLOOD (ROUTINE X 2)

## 2016-04-05 ENCOUNTER — Encounter: Payer: Self-pay | Admitting: Emergency Medicine

## 2016-04-05 ENCOUNTER — Emergency Department
Admission: EM | Admit: 2016-04-05 | Discharge: 2016-04-05 | Disposition: A | Discharge status: Home / Self Care | Payer: Medicaid Other | Attending: Emergency Medicine | Admitting: Emergency Medicine

## 2016-04-05 DIAGNOSIS — E119 Type 2 diabetes mellitus without complications: Secondary | ICD-10-CM | POA: Insufficient documentation

## 2016-04-05 DIAGNOSIS — H1031 Unspecified acute conjunctivitis, right eye: Secondary | ICD-10-CM | POA: Insufficient documentation

## 2016-04-05 DIAGNOSIS — H00012 Hordeolum externum right lower eyelid: Secondary | ICD-10-CM | POA: Insufficient documentation

## 2016-04-05 DIAGNOSIS — Z794 Long term (current) use of insulin: Secondary | ICD-10-CM | POA: Diagnosis not present

## 2016-04-05 DIAGNOSIS — H5711 Ocular pain, right eye: Secondary | ICD-10-CM | POA: Diagnosis present

## 2016-04-05 DIAGNOSIS — F17210 Nicotine dependence, cigarettes, uncomplicated: Secondary | ICD-10-CM | POA: Insufficient documentation

## 2016-04-05 MED ORDER — OFLOXACIN 0.3 % OP SOLN: 2.0000 [drp] | Freq: Four times a day (QID) | OPHTHALMIC | 0 refills | Status: AC

## 2016-04-05 NOTE — ED Triage Notes (Addendum)
Pt in via POV with complaints of having a stye on right eye for approximately one week with itching, burning, yellow drainage.  Pt reports some blurred vision in right eye.  Swelling, redness noted to right eye.

## 2016-04-05 NOTE — ED Provider Notes (Signed)
[facility] Emergency Department Provider Note  ____________________________________________  Time seen: Approximately 11:55 AM  I have reviewed the triage vital signs and the nursing notes.   HISTORY  Chief Complaint Eye Pain    HPI Victoria Cortez is a 30 y.o. female who presents with right eye pain and swelling x5 days. Had a stye that she applied hot compresses to and drained, but then noticed the swelling of both lids 2 days after. States she gets styes often. Also experiencing blurred vision in the right eye and headache. Pain in right eye is throbbing, and worse when she touches her eye. Patient has continued warm compresses but no other treatment. Denies fever, congestion, sore throat. Patient does not wear contact lenses and denies trauma to the eye.    Past Medical History:  Diagnosis Date  . Diabetes mellitus without complication (HCC)   . Sickle cell trait [facility](HCC)     Patient Active Problem List   Diagnosis Date Noted  . Bacteremia 11/29/2015  . H/O cesarean section 02/03/2015  . S/P cesarean section 02/03/2015  . Uncontrolled diabetes mellitus (HCC) 01/12/2015  . Labor and delivery, indication for care 01/09/2015  . Pregnancy 11/26/2014    Past Surgical History:  Procedure Laterality Date  . CESAREAN SECTION    . CESAREAN SECTION WITH BILATERAL TUBAL LIGATION N/A 02/03/2015   Procedure: CESAREAN SECTION WITH BILATERAL TUBAL LIGATION;  Surgeon: Christeen DouglasBethany Beasley, MD;  Location: [facility] ORS;  Service: Obstetrics;  Laterality: N/A;    Prior to Admission medications   Medication Sig Start Date End Date Taking? Authorizing Provider  ciprofloxacin (CIPRO) 500 MG tablet Take 1 tablet (500 mg total) by mouth 2 (two) times daily. 11/30/15   Milagros LollSrikar Sudini, MD  HYDROcodone-acetaminophen (NORCO) 5-325 MG tablet Take 1 tablet by mouth every 6 (six) hours as needed for moderate pain. 11/28/15   Irean HongJade J Sung, MD  ibuprofen (ADVIL,MOTRIN) 800 MG tablet Take 1  tablet (800 mg total) by mouth every 8 (eight) hours as needed for moderate pain. 11/28/15   Irean HongJade J Sung, MD  insulin detemir (LEVEMIR) 100 UNIT/ML injection Inject 0.16 mLs (16 Units total) into the skin 2 (two) times daily. In evening and morning. 02/07/15   Christeen DouglasBethany Beasley, MD  ofloxacin (OCUFLOX) 0.3 % ophthalmic solution Place 2 drops into the right eye 4 (four) times daily. 04/05/16 04/12/16  Christiane HaJonathan D Felicie Kocher, PA-C  ondansetron (ZOFRAN ODT) 4 MG disintegrating tablet Take 1 tablet (4 mg total) by mouth every 8 (eight) hours as needed for nausea or vomiting. 11/28/15   Irean HongJade J Sung, MD    Allergies Rocephin [ceftriaxone]  Family History  Problem Relation Age of Onset  . Sickle cell anemia      Social History Social History  Substance Use Topics  . Smoking status: Current Every Day Smoker    Packs/day: 1.00    Types: Cigarettes  . Smokeless tobacco: Never Used  . Alcohol use 0.0 oz/week     Comment: occ     Review of Systems  Constitutional: No fever/chills Eyes: Blurred vision in right eye, left vision is normal. Pain and swelling in right eye. Styes to right eye.  ENT: No upper respiratory complaints. Neurological: Positive for headache.    ____________________________________________   PHYSICAL EXAM:  VITAL SIGNS: ED Triage Vitals [04/05/16 1153]  Enc Vitals Group     BP      Pulse      Resp      Temp  Temp src      SpO2      Weight      Height      Head Circumference      Peak Flow      Pain Score 7     Pain Loc      Pain Edu?      Excl. in GC?      Constitutional: Alert and oriented. Well appearing and in no acute distress. Eyes: Right conjunctiva mildly edematous and erythematous with multiple hordeolums, left eye normal. Right sclera mildly injected, left is normal. PERRL. EOMI. Head: Atraumatic. ENT:      Nose: No congestion/rhinnorhea.      Mouth/Throat: Mucous membranes are moist. Oropharynx non-erythematous and without exudate.  Neck:  No stridor. Supple, full ROM without pain or difficulty.  Hematological/Lymphatic/Immunilogical: No cervical lymphadenopathy.  Cardiovascular: Normal rate, regular rhythm. Normal S1 and S2.  Good peripheral circulation. Respiratory: Normal respiratory effort without tachypnea or retractions. Lungs CTAB. Good air entry to the bases with no decreased or absent breath sounds. Neurologic:  Normal speech and language. No gross focal neurologic deficits are appreciated.  Skin:  Skin is warm, dry and intact. No rash noted. Psychiatric: Mood and affect are normal. Speech and behavior are normal. Patient exhibits appropriate insight and judgement.   ____________________________________________   LABS (all labs ordered are listed, but only abnormal results are displayed)  Labs Reviewed - No data to display ____________________________________________  EKG   ____________________________________________  RADIOLOGY   No results found.  ____________________________________________    PROCEDURES  Procedure(s) performed:    Procedures    Medications - No data to display   ____________________________________________   INITIAL IMPRESSION / ASSESSMENT AND PLAN / ED COURSE  Pertinent labs & imaging results that were available during my care of the patient were reviewed by me and considered in my medical decision making (see chart for details).  Review of the [place] CSRS was performed in accordance of the [facility] prior to dispensing any controlled drugs.  Clinical Course    Patient's diagnosis is consistent with bacterial conjunctivitis with multiple hordeolum of the right eye. Patient will be discharged home with prescriptions for ofloxacin eye drops. Patient is to follow up with ophthalmology as needed or otherwise directed. Patient is given ED precautions to return to the ED for any worsening or new symptoms.No other emergency medicine complaints at this time.       ____________________________________________  FINAL CLINICAL IMPRESSION(S) / ED DIAGNOSES  Final diagnoses:  Hordeolum externum of right lower eyelid  Acute bacterial conjunctivitis of right eye      NEW MEDICATIONS STARTED DURING THIS VISIT:  Discharge Medication List as of 04/05/2016 12:07 PM    START taking these medications   Details  ofloxacin (OCUFLOX) 0.3 % ophthalmic solution Place 2 drops into the right eye 4 (four) times daily., Starting Fri 04/05/2016, Until Fri 04/12/2016, Print            This chart was dictated using voice recognition software/Dragon. Despite best efforts to proofread, errors can occur which can change the meaning. Any change was purely unintentional.   Racheal Patches, PA-C 04/05/16 1328    Governor Rooks, MD 04/05/16 1358

## 2016-04-19 ENCOUNTER — Emergency Department
Admission: EM | Admit: 2016-04-19 | Discharge: 2016-04-19 | Disposition: A | Discharge status: Home / Self Care | Payer: Medicaid Other | Attending: Emergency Medicine | Admitting: Emergency Medicine

## 2016-04-19 DIAGNOSIS — K029 Dental caries, unspecified: Secondary | ICD-10-CM

## 2016-04-19 DIAGNOSIS — Z79899 Other long term (current) drug therapy: Secondary | ICD-10-CM | POA: Diagnosis not present

## 2016-04-19 DIAGNOSIS — Z794 Long term (current) use of insulin: Secondary | ICD-10-CM | POA: Insufficient documentation

## 2016-04-19 DIAGNOSIS — E119 Type 2 diabetes mellitus without complications: Secondary | ICD-10-CM | POA: Diagnosis not present

## 2016-04-19 DIAGNOSIS — F17210 Nicotine dependence, cigarettes, uncomplicated: Secondary | ICD-10-CM | POA: Insufficient documentation

## 2016-04-19 DIAGNOSIS — K0889 Other specified disorders of teeth and supporting structures: Secondary | ICD-10-CM | POA: Diagnosis present

## 2016-04-19 MED ORDER — IBUPROFEN 800 MG PO TABS: 800.0000 mg | ORAL_TABLET | Freq: Three times a day (TID) | ORAL | 0 refills | Status: DC | PRN

## 2016-04-19 MED ORDER — LIDOCAINE VISCOUS 2 % MT SOLN
OROMUCOSAL | Status: AC
  Administered 2016-04-19: 15 mL via OROMUCOSAL
  Filled 2016-04-19: qty 15

## 2016-04-19 MED ORDER — LIDOCAINE VISCOUS 2 % MT SOLN
15.0000 mL | Freq: Once | OROMUCOSAL | Status: AC
  Administered 2016-04-19: 15 mL via OROMUCOSAL

## 2016-04-19 MED ORDER — AMOXICILLIN 500 MG PO TABS: 500.0000 mg | ORAL_TABLET | Freq: Two times a day (BID) | ORAL | 0 refills | Status: DC

## 2016-04-19 MED ORDER — OXYCODONE-ACETAMINOPHEN 5-325 MG PO TABS
1.0000 | ORAL_TABLET | Freq: Once | ORAL | Status: AC
  Administered 2016-04-19: 1 via ORAL
  Filled 2016-04-19: qty 1

## 2016-04-19 MED ORDER — LIDOCAINE VISCOUS 2 % MT SOLN
15.0000 mL | Freq: Once | OROMUCOSAL | Status: AC
  Administered 2016-04-19: 15 mL via OROMUCOSAL
  Filled 2016-04-19: qty 15

## 2016-04-19 MED ORDER — AMOXICILLIN 500 MG PO CAPS
500.0000 mg | ORAL_CAPSULE | Freq: Once | ORAL | Status: AC
  Administered 2016-04-19: 500 mg via ORAL
  Filled 2016-04-19: qty 1

## 2016-04-19 NOTE — ED Triage Notes (Signed)
Pt came in c/o tooth pain to Right lower jaw.  Pt reports taking 200mg  of "tylenol" or "2 100mg  tylenol tabs" to EMS.  Pt refusing to talk at this time due to pain.  EDP evaluated and to put in orders.

## 2016-04-19 NOTE — ED Notes (Signed)
Discharge instructions reviewed with patient. Questions fielded by this RN. Patient verbalizes understanding of instructions. Patient discharged home in stable condition per Brown MD . No acute distress noted at time of discharge.   

## 2016-04-19 NOTE — ED Provider Notes (Signed)
[facility] Emergency Department Provider Note    First MD Initiated Contact with Patient 04/19/16 [phone]     (approximate)  I have reviewed the triage vital signs and the nursing notes.   HISTORY  Chief Complaint Dental Pain    HPI Victoria Cortez is a 30 y.o. female history diabetes presents via EMS with right mandibular molar dental pain times today. Patient states that she took "200 mg of Tylenol without any relief". Patient denies any fever no difficulty swallowing   Past Medical History:  Diagnosis Date  . Diabetes mellitus without complication (HCC)   . Sickle cell trait [facility](HCC)     Patient Active Problem List   Diagnosis Date Noted  . Bacteremia 11/29/2015  . H/O cesarean section 02/03/2015  . S/P cesarean section 02/03/2015  . Uncontrolled diabetes mellitus (HCC) 01/12/2015  . Labor and delivery, indication for care 01/09/2015  . Pregnancy 11/26/2014    Past Surgical History:  Procedure Laterality Date  . CESAREAN SECTION    . CESAREAN SECTION WITH BILATERAL TUBAL LIGATION N/A 02/03/2015   Procedure: CESAREAN SECTION WITH BILATERAL TUBAL LIGATION;  Surgeon: Christeen DouglasBethany Beasley, MD;  Location: [facility] ORS;  Service: Obstetrics;  Laterality: N/A;    Prior to Admission medications   Medication Sig Start Date End Date Taking? Authorizing Provider  ciprofloxacin (CIPRO) 500 MG tablet Take 1 tablet (500 mg total) by mouth 2 (two) times daily. 11/30/15   Milagros LollSrikar Sudini, MD  HYDROcodone-acetaminophen (NORCO) 5-325 MG tablet Take 1 tablet by mouth every 6 (six) hours as needed for moderate pain. 11/28/15   Irean HongJade J Sung, MD  ibuprofen (ADVIL,MOTRIN) 800 MG tablet Take 1 tablet (800 mg total) by mouth every 8 (eight) hours as needed for moderate pain. 11/28/15   Irean HongJade J Sung, MD  insulin detemir (LEVEMIR) 100 UNIT/ML injection Inject 0.16 mLs (16 Units total) into the skin 2 (two) times daily. In evening and morning. 02/07/15   Christeen DouglasBethany Beasley, MD  ondansetron  (ZOFRAN ODT) 4 MG disintegrating tablet Take 1 tablet (4 mg total) by mouth every 8 (eight) hours as needed for nausea or vomiting. 11/28/15   Irean HongJade J Sung, MD    Allergies Rocephin [ceftriaxone]  Family History  Problem Relation Age of Onset  . Sickle cell anemia      Social History Social History  Substance Use Topics  . Smoking status: Current Every Day Smoker    Packs/day: 1.00    Types: Cigarettes  . Smokeless tobacco: Never Used  . Alcohol use 0.0 oz/week     Comment: occ    Review of Systems Constitutional: No fever/chills Eyes: No visual changes. ENT: No sore throat.Positive for dental pain Cardiovascular: Denies chest pain. Respiratory: Denies shortness of breath. Gastrointestinal: No abdominal pain.  No nausea, no vomiting.  No diarrhea.  No constipation. Genitourinary: Negative for dysuria. Musculoskeletal: Negative for back pain. Skin: Negative for rash. Neurological: Negative for headaches, focal weakness or numbness.  10-point ROS otherwise negative.  ____________________________________________   PHYSICAL EXAM:  VITAL SIGNS: ED Triage Vitals  Enc Vitals Group     BP 04/19/16 0234 125/89     Pulse Rate 04/19/16 0234 76     Resp 04/19/16 0234 16     Temp 04/19/16 0234 98.6 F (37 C)     Temp Source 04/19/16 0234 Oral     SpO2 04/19/16 0234 100 %     Weight 04/19/16 0235 172 lb (78 kg)     Height 04/19/16 0235  5' (1.524 m)     Head Circumference --      Peak Flow --      Pain Score 04/19/16 0235 10     Pain Loc --      Pain Edu? --      Excl. in GC? --     Constitutional: Alert and oriented. Well appearing and in no acute distress. Eyes: Conjunctivae are normal. PERRL. EOMI. Head: Atraumatic. Nose: No congestion/rhinnorhea. Mouth/Throat: Mucous membranes are moist.  Oropharynx non-erythematous.Right posterior molar dental caries with gum erythema concern for abscess Neck: No stridor.  No meningeal signs.  Musculoskeletal: No lower extremity  tenderness nor edema. No gross deformities of extremities. Neurologic:  Normal speech and language. No gross focal neurologic deficits are appreciated.  Skin:  Skin is warm, dry and intact. No rash noted. Psychiatric: Mood and affect are normal. Speech and behavior are normal.     Procedures     INITIAL IMPRESSION / ASSESSMENT AND PLAN / ED COURSE  Pertinent labs & imaging results that were available during my care of the patient were reviewed by me and considered in my medical decision making (see chart for details).  Patient given viscous lidocaine swish and spit one Percocet and amoxicillin emergency department. Patient will be prescribed ibuprofen 800 mg tablets and amoxicillin for home.   Clinical Course    ____________________________________________  FINAL CLINICAL IMPRESSION(S) / ED DIAGNOSES  Final diagnoses:  Dental caries     MEDICATIONS GIVEN DURING THIS VISIT:  Medications  oxyCODONE-acetaminophen (PERCOCET/ROXICET) 5-325 MG per tablet 1 tablet (1 tablet Oral Given 04/19/16 0245)  lidocaine (XYLOCAINE) 2 % viscous mouth solution 15 mL (15 mLs Mouth/Throat Given 04/19/16 0245)  amoxicillin (AMOXIL) capsule 500 mg (500 mg Oral Given 04/19/16 0245)     NEW OUTPATIENT MEDICATIONS STARTED DURING THIS VISIT:  New Prescriptions   No medications on file    Modified Medications   No medications on file    Discontinued Medications   No medications on file     Note:  This document was prepared using Dragon voice recognition software and may include unintentional dictation errors.    Darci Current, MD 04/19/16 [phone]

## 2017-07-02 ENCOUNTER — Other Ambulatory Visit: Payer: Self-pay

## 2017-07-02 ENCOUNTER — Emergency Department
Admission: EM | Admit: 2017-07-02 | Discharge: 2017-07-02 | Disposition: A | Discharge status: Home / Self Care | Payer: Medicaid Other | Attending: Emergency Medicine | Admitting: Emergency Medicine

## 2017-07-02 ENCOUNTER — Encounter: Payer: Self-pay | Admitting: Emergency Medicine

## 2017-07-02 DIAGNOSIS — T782XXA Anaphylactic shock, unspecified, initial encounter: Secondary | ICD-10-CM | POA: Diagnosis not present

## 2017-07-02 DIAGNOSIS — E119 Type 2 diabetes mellitus without complications: Secondary | ICD-10-CM | POA: Insufficient documentation

## 2017-07-02 DIAGNOSIS — Z79899 Other long term (current) drug therapy: Secondary | ICD-10-CM | POA: Insufficient documentation

## 2017-07-02 DIAGNOSIS — L509 Urticaria, unspecified: Secondary | ICD-10-CM | POA: Diagnosis present

## 2017-07-02 DIAGNOSIS — F17210 Nicotine dependence, cigarettes, uncomplicated: Secondary | ICD-10-CM | POA: Insufficient documentation

## 2017-07-02 MED ORDER — EPINEPHRINE 0.15 MG/0.3ML IJ SOAJ: 0.1500 mg | INTRAMUSCULAR | 0 refills | Status: DC | PRN

## 2017-07-02 MED ORDER — EPINEPHRINE 0.3 MG/0.3ML IJ SOAJ
0.3000 mg | Freq: Once | INTRAMUSCULAR | Status: AC
Start: 2017-07-02 — End: 2017-07-02
  Administered 2017-07-02: 0.3 mg via INTRAMUSCULAR

## 2017-07-02 MED ORDER — LORAZEPAM 2 MG/ML IJ SOLN
INTRAMUSCULAR | Status: AC
  Administered 2017-07-02: 1 mg via INTRAVENOUS
  Filled 2017-07-02: qty 1

## 2017-07-02 MED ORDER — LORAZEPAM 2 MG/ML IJ SOLN
1.0000 mg | Freq: Once | INTRAMUSCULAR | Status: AC
Start: 2017-07-02 — End: 2017-07-02
  Administered 2017-07-02: 1 mg via INTRAVENOUS

## 2017-07-02 MED ORDER — PREDNISONE 20 MG PO TABS: 60.0000 mg | ORAL_TABLET | Freq: Every day | ORAL | 0 refills | Status: AC

## 2017-07-02 MED ORDER — METHYLPREDNISOLONE SODIUM SUCC 125 MG IJ SOLR
125.0000 mg | Freq: Once | INTRAMUSCULAR | Status: AC
  Administered 2017-07-02: 125 mg via INTRAVENOUS

## 2017-07-02 MED ORDER — FAMOTIDINE IN NACL 20-0.9 MG/50ML-% IV SOLN
20.0000 mg | Freq: Once | INTRAVENOUS | Status: AC
  Administered 2017-07-02: 20 mg via INTRAVENOUS

## 2017-07-02 NOTE — ED Provider Notes (Signed)
[facility] Emergency Department Provider Note   First MD Initiated Contact with Patient 07/02/17 0320     (approximate)  I have reviewed the triage vital signs and the nursing notes.   HISTORY  Chief Complaint Allergic Reaction    HPI Victoria Cortez is a 32 y.o. female below list of chronic medical conditions presents to the emergency department acute onset of generalized pruritus and rash hives) which the patient's stated began at 2 AM.  Patient presented via EMS stating that she felt as though her throat was closing with difficulty breathing and swallowing.  EMS administered 50 mg of IV Benadryl with improvement of pruritus however patient continues to admit to sensation that her throat is closing.  Patient unsure of what the cause of her pruritus and associated symptoms   Past Medical History:  Diagnosis Date  . Diabetes mellitus without complication (HCC)   . Sickle cell trait [facility](HCC)     Patient Active Problem List   Diagnosis Date Noted  . Bacteremia 11/29/2015  . H/O cesarean section 02/03/2015  . S/P cesarean section 02/03/2015  . Uncontrolled diabetes mellitus (HCC) 01/12/2015  . Labor and delivery, indication for care 01/09/2015  . Pregnancy 11/26/2014    Past Surgical History:  Procedure Laterality Date  . CESAREAN SECTION    . CESAREAN SECTION WITH BILATERAL TUBAL LIGATION N/A 02/03/2015   Procedure: CESAREAN SECTION WITH BILATERAL TUBAL LIGATION;  Surgeon: Christeen DouglasBethany Beasley, MD;  Location: [facility] ORS;  Service: Obstetrics;  Laterality: N/A;    Prior to Admission medications   Medication Sig Start Date End Date Taking? Authorizing Provider  amoxicillin (AMOXIL) 500 MG tablet Take 1 tablet (500 mg total) by mouth 2 (two) times daily. 04/19/16   Darci CurrentBrown, [place] N, MD  ciprofloxacin (CIPRO) 500 MG tablet Take 1 tablet (500 mg total) by mouth 2 (two) times daily. 11/30/15   Milagros LollSudini, Srikar, MD  HYDROcodone-acetaminophen (NORCO) 5-325 MG tablet  Take 1 tablet by mouth every 6 (six) hours as needed for moderate pain. 11/28/15   Irean HongSung, Jade J, MD  ibuprofen (ADVIL,MOTRIN) 800 MG tablet Take 1 tablet (800 mg total) by mouth every 8 (eight) hours as needed for moderate pain. 11/28/15   Irean HongSung, Jade J, MD  ibuprofen (ADVIL,MOTRIN) 800 MG tablet Take 1 tablet (800 mg total) by mouth every 8 (eight) hours as needed. 04/19/16   Darci CurrentBrown, [place] N, MD  insulin detemir (LEVEMIR) 100 UNIT/ML injection Inject 0.16 mLs (16 Units total) into the skin 2 (two) times daily. In evening and morning. 02/07/15   Christeen DouglasBeasley, Bethany, MD  ondansetron (ZOFRAN ODT) 4 MG disintegrating tablet Take 1 tablet (4 mg total) by mouth every 8 (eight) hours as needed for nausea or vomiting. 11/28/15   Irean HongSung, Jade J, MD    Allergies Rocephin [ceftriaxone]  Family History  Problem Relation Age of Onset  . Sickle cell anemia Unknown     Social History Social History   Tobacco Use  . Smoking status: Current Every Day Smoker    Packs/day: 1.00    Types: Cigarettes  . Smokeless tobacco: Never Used  Substance Use Topics  . Alcohol use: Yes    Alcohol/week: 0.0 oz    Comment: occ  . Drug use: No    Review of Systems Constitutional: No fever/chills Eyes: No visual changes. ENT: No sore throat. Cardiovascular: Denies chest pain. Respiratory: Denies shortness of breath. Gastrointestinal: No abdominal pain.  No nausea, no vomiting.  No diarrhea.  No constipation. Genitourinary:  Negative for dysuria. Musculoskeletal: Negative for neck pain.  Negative for back pain. Integumentary: Positive for generalized pruritus and rash Neurological: Negative for headaches, focal weakness or numbness.   ____________________________________________   PHYSICAL EXAM:  VITAL SIGNS: ED Triage Vitals  Enc Vitals Group     BP 07/02/17 0330 (!) 110/96     Pulse Rate 07/02/17 0330 (!) 107     Resp 07/02/17 0330 20     Temp 07/02/17 0331 98.7 F (37.1 C)     Temp Source 07/02/17 0331  Oral     SpO2 07/02/17 0330 100 %     Weight 07/02/17 0331 83 kg (183 lb)     Height 07/02/17 0331 1.524 m (5')     Head Circumference --      Peak Flow --      Pain Score --      Pain Loc --      Pain Edu? --      Excl. in GC? --      Constitutional: Alert and oriented. Well appearing and in no acute distress. Eyes: Conjunctivae are normal.  Head: Atraumatic. Mouth/Throat: Mucous membranes are moist.  Oropharynx non-erythematous. Neck: No stridor.   Cardiovascular: Normal rate, regular rhythm. Good peripheral circulation. Grossly normal heart sounds. Respiratory: Normal respiratory effort.  No retractions. Lungs CTAB. Gastrointestinal: Soft and nontender. No distention.  Musculoskeletal: No lower extremity tenderness nor edema. No gross deformities of extremities. Neurologic:  Normal speech and language. No gross focal neurologic deficits are appreciated.  Skin:  Skin is warm, dry and intact. No rash noted. Psychiatric: Mood and affect are normal. Speech and behavior are normal.   EKG  ED ECG REPORT I, [place] N Bridgit Eynon, the attending physician, personally viewed and interpreted this ECG.   Date: 07/02/2017  EKG Time: 3:28 AM  Rate: 99  Rhythm: Normal sinus rhythm  Axis: Normal  Intervals: Normal  ST&T Change: None    Critical care: CRITICAL CARE Performed by: Darci Current   Total critical care time: 30 minutes  Critical care time was exclusive of separately billable procedures and treating other patients.  Critical care was necessary to treat or prevent imminent or life-threatening deterioration.  Critical care was time spent personally by me on the following activities: development of treatment plan with patient and/or surrogate as well as nursing, discussions with consultants, evaluation of patient's response to treatment, examination of patient, obtaining history from patient or surrogate, ordering and performing treatments and interventions, ordering and  review of laboratory studies, ordering and review of radiographic studies, pulse oximetry and re-evaluation of patient's condition.  Procedures   ____________________________________________   INITIAL IMPRESSION / ASSESSMENT AND PLAN / ED COURSE  As part of my medical decision making, I reviewed the following data within the electronic MEDICAL RECORD 20 year old female presented with above-stated history and physical exam consistent with allergic reaction with anaphylaxis as such patient given Solu-Medrol and 25 mg Pepcid and EpiPen on arrival to the emergency department.  Patient symptoms resolved following administration of aforementioned medications.  Patient will be referred to allergist Dr. Elenore Rota patient will be prescribed EpiPen and prednisone for home ____________________________________________  FINAL CLINICAL IMPRESSION(S) / ED DIAGNOSES  Final diagnoses:  Anaphylaxis, initial encounter     MEDICATIONS GIVEN DURING THIS VISIT:  Medications  methylPREDNISolone sodium succinate (SOLU-MEDROL) 125 mg/2 mL injection 125 mg (125 mg Intravenous Given 07/02/17 0337)  famotidine (PEPCID) IVPB 20 mg premix (0 mg Intravenous Stopped 07/02/17 0400)  LORazepam (ATIVAN) injection 1  mg (1 mg Intravenous Given 07/02/17 0336)  EPINEPHrine (EPI-PEN) injection 0.3 mg (0.3 mg Intramuscular Given 07/02/17 1308)     ED Discharge Orders    None       Note:  This document was prepared using Dragon voice recognition software and may include unintentional dictation errors.    Darci Current, MD 07/02/17 [phone]

## 2017-07-02 NOTE — ED Triage Notes (Signed)
Patient to ER via [facility] from home for c/o allergic reaction. Patient got off work at 11pm, ate chicken that had jalapeno in seasoning. Patient denies any history of reaction from the same. Patient states she woke up at approx 0200 with severe itching. Patient states she began to feel like throat was closing. Patient shaky upon arrival, but feels "maybe a little better after Benadryl".

## 2017-08-13 ENCOUNTER — Emergency Department: Payer: Medicaid Other

## 2017-08-13 ENCOUNTER — Other Ambulatory Visit: Payer: Self-pay

## 2017-08-13 ENCOUNTER — Encounter: Payer: Self-pay | Admitting: Emergency Medicine

## 2017-08-13 ENCOUNTER — Emergency Department
Admission: EM | Admit: 2017-08-13 | Discharge: 2017-08-13 | Disposition: A | Discharge status: Home / Self Care | Payer: Medicaid Other | Attending: Emergency Medicine | Admitting: Emergency Medicine

## 2017-08-13 DIAGNOSIS — A599 Trichomoniasis, unspecified: Secondary | ICD-10-CM | POA: Diagnosis not present

## 2017-08-13 DIAGNOSIS — E119 Type 2 diabetes mellitus without complications: Secondary | ICD-10-CM | POA: Diagnosis not present

## 2017-08-13 DIAGNOSIS — F17210 Nicotine dependence, cigarettes, uncomplicated: Secondary | ICD-10-CM | POA: Insufficient documentation

## 2017-08-13 DIAGNOSIS — R102 Pelvic and perineal pain: Secondary | ICD-10-CM | POA: Diagnosis present

## 2017-08-13 DIAGNOSIS — Z79899 Other long term (current) drug therapy: Secondary | ICD-10-CM | POA: Insufficient documentation

## 2017-08-13 DIAGNOSIS — Z794 Long term (current) use of insulin: Secondary | ICD-10-CM | POA: Insufficient documentation

## 2017-08-13 LAB — COMPREHENSIVE METABOLIC PANEL
ALT: 13 U/L — AB (ref 14–54)
ANION GAP: 10 (ref 5–15)
AST: 14 U/L — ABNORMAL LOW (ref 15–41)
Albumin: 3.6 g/dL (ref 3.5–5.0)
Alkaline Phosphatase: 99 U/L (ref 38–126)
BUN: 9 mg/dL (ref 6–20)
CHLORIDE: 100 mmol/L — AB (ref 101–111)
CO2: 24 mmol/L (ref 22–32)
Calcium: 9.6 mg/dL (ref 8.9–10.3)
Creatinine, Ser: 0.82 mg/dL (ref 0.44–1.00)
GFR calc non Af Amer: >60 mL/min (ref >60)
Glucose, Bld: 342 mg/dL — ABNORMAL HIGH (ref 65–99)
Potassium: 3.9 mmol/L (ref 3.5–5.1)
SODIUM: 134 mmol/L — AB (ref 135–145)
Total Bilirubin: 0.7 mg/dL (ref 0.3–1.2)
Total Protein: 7.1 g/dL (ref 6.5–8.1)

## 2017-08-13 LAB — URINALYSIS, COMPLETE (UACMP) WITH MICROSCOPIC
Bilirubin Urine: NEGATIVE
Glucose, UA: >=500 mg/dL — AB
Ketones, ur: NEGATIVE mg/dL
NITRITE: NEGATIVE
Protein, ur: NEGATIVE mg/dL
SPECIFIC GRAVITY, URINE: 1.017 (ref 1.005–1.030)
pH: 6 (ref 5.0–8.0)

## 2017-08-13 LAB — CBC WITH DIFFERENTIAL/PLATELET
Basophils Absolute: 0 10*3/uL (ref 0–0.1)
Basophils Relative: 0 %
EOS ABS: 0.1 10*3/uL (ref 0–0.7)
EOS PCT: 1 %
HCT: 39.3 % (ref 35.0–47.0)
Hemoglobin: 13.4 g/dL (ref 12.0–16.0)
LYMPHS ABS: 2.1 10*3/uL (ref 1.0–3.6)
Lymphocytes Relative: 20 %
MCH: 29.7 pg (ref 26.0–34.0)
MCHC: 34 g/dL (ref 32.0–36.0)
MCV: 87.5 fL (ref 80.0–100.0)
Monocytes Absolute: 0.7 10*3/uL (ref 0.2–0.9)
Monocytes Relative: 7 %
Neutro Abs: 7.3 10*3/uL — ABNORMAL HIGH (ref 1.4–6.5)
Neutrophils Relative %: 72 %
Platelets: 247 10*3/uL (ref 150–440)
RBC: 4.5 MIL/uL (ref 3.80–5.20)
RDW: 13.8 % (ref 11.5–14.5)
WBC: 10.3 10*3/uL (ref 3.6–11.0)

## 2017-08-13 LAB — WET PREP, GENITAL
Sperm: NONE SEEN
YEAST WET PREP: NONE SEEN

## 2017-08-13 LAB — CHLAMYDIA/NGC RT PCR (ARMC ONLY)
Chlamydia Tr: NOT DETECTED
N GONORRHOEAE: NOT DETECTED

## 2017-08-13 LAB — PREGNANCY, URINE: PREG TEST UR: NEGATIVE

## 2017-08-13 LAB — LIPASE, BLOOD: Lipase: 37 U/L (ref 11–51)

## 2017-08-13 MED ORDER — METRONIDAZOLE 500 MG PO TABS: 500.0000 mg | ORAL_TABLET | Freq: Two times a day (BID) | ORAL | 0 refills | Status: DC

## 2017-08-13 MED ORDER — DOXYCYCLINE HYCLATE 100 MG PO CAPS: 100.0000 mg | ORAL_CAPSULE | Freq: Two times a day (BID) | ORAL | 0 refills | Status: DC

## 2017-08-13 MED ORDER — METRONIDAZOLE 500 MG PO TABS
500.0000 mg | ORAL_TABLET | Freq: Once | ORAL | Status: AC
  Administered 2017-08-13: 500 mg via ORAL
  Filled 2017-08-13: qty 1

## 2017-08-13 MED ORDER — AZITHROMYCIN 500 MG PO TABS
1000.0000 mg | ORAL_TABLET | Freq: Once | ORAL | Status: AC
  Administered 2017-08-13: 1000 mg via ORAL
  Filled 2017-08-13: qty 2

## 2017-08-13 NOTE — ED Notes (Signed)
Pt alert and oriented X4, active, cooperative, pt in NAD. RR even and unlabored, color WNL.  Pt informed to return if any life threatening symptoms occur.  Discharge and followup instructions reviewed.  

## 2017-08-13 NOTE — ED Provider Notes (Addendum)
[facility] Emergency Department Provider Note  ____________________________________________   I have reviewed the triage vital signs and the nursing notes. Where available I have reviewed prior notes and, if possible and indicated, outside hospital notes.    HISTORY  Chief Complaint Abdominal Pain    HPI Victoria Cortez is a 32 y.o. female presents today complaining of lower pelvic discomfort.  She states she also had some bright red blood from her vagina yesterday.  There is a typical her last menstrual period was on the fourth of this month she does not usually have irregular menstrual periods.  She has had a cramping suprapubic discomfort.  Nothing makes it better nothing makes it worse, no radiation.  Mild to moderate in severity.  She has had no other systemic illness such as vomiting or fever.  It does not go to one side or the other is suprapubic.  She has not been sexually active for several months and denies the possibility of STI.  She states she does not have any other acute complaints but she is concerned about having had a scant amount of bright red blood per rectum on the toilet paper yesterday, and this cramping.      Past Medical History:  Diagnosis Date  . Diabetes mellitus without complication (HCC)   . Sickle cell trait [facility])     Patient Active Problem List   Diagnosis Date Noted  . Bacteremia 11/29/2015  . H/O cesarean section 02/03/2015  . S/P cesarean section 02/03/2015  . Uncontrolled diabetes mellitus (HCC) 01/12/2015  . Labor and delivery, indication for care 01/09/2015  . Pregnancy 11/26/2014    Past Surgical History:  Procedure Laterality Date  . CESAREAN SECTION    . CESAREAN SECTION WITH BILATERAL TUBAL LIGATION N/A 02/03/2015   Procedure: CESAREAN SECTION WITH BILATERAL TUBAL LIGATION;  Surgeon: Christeen Douglas, MD;  Location: [facility] ORS;  Service: Obstetrics;  Laterality: N/A;    Prior to Admission medications    Medication Sig Start Date End Date Taking? Authorizing Provider  amoxicillin (AMOXIL) 500 MG tablet Take 1 tablet (500 mg total) by mouth 2 (two) times daily. Patient not taking: Reported on 08/13/2017 04/19/16   Darci Current, MD  ciprofloxacin (CIPRO) 500 MG tablet Take 1 tablet (500 mg total) by mouth 2 (two) times daily. Patient not taking: Reported on 08/13/2017 11/30/15   Milagros Loll, MD  EPINEPHrine (EPIPEN JR 2-PAK) 0.15 MG/0.3ML injection Inject 0.3 mLs (0.15 mg total) into the muscle as needed for anaphylaxis. Patient not taking: Reported on 08/13/2017 07/02/17   Darci Current, MD  HYDROcodone-acetaminophen [facility]) 5-325 MG tablet Take 1 tablet by mouth every 6 (six) hours as needed for moderate pain. Patient not taking: Reported on 08/13/2017 11/28/15   Irean Hong, MD  ibuprofen (ADVIL,MOTRIN) 800 MG tablet Take 1 tablet (800 mg total) by mouth every 8 (eight) hours as needed for moderate pain. Patient not taking: Reported on 08/13/2017 11/28/15   Irean Hong, MD  insulin detemir (LEVEMIR) 100 UNIT/ML injection Inject 0.16 mLs (16 Units total) into the skin 2 (two) times daily. In evening and morning. Patient not taking: Reported on 08/13/2017 02/07/15   Christeen Douglas, MD  ondansetron (ZOFRAN ODT) 4 MG disintegrating tablet Take 1 tablet (4 mg total) by mouth every 8 (eight) hours as needed for nausea or vomiting. Patient not taking: Reported on 08/13/2017 11/28/15   Irean Hong, MD    Allergies Rocephin [ceftriaxone]  Family History  Problem Relation  Age of Onset  . Sickle cell anemia Unknown     Social History Social History   Tobacco Use  . Smoking status: Current Every Day Smoker    Packs/day: 1.00    Types: Cigarettes  . Smokeless tobacco: Never Used  Substance Use Topics  . Alcohol use: Yes    Alcohol/week: 0.0 oz    Comment: occ  . Drug use: No    Review of Systems Constitutional: No fever/chills Eyes: No visual changes. ENT: No sore throat. No stiff  neck no neck pain Cardiovascular: Denies chest pain. Respiratory: Denies shortness of breath. Gastrointestinal:   no vomiting.  No diarrhea.  No constipation. Genitourinary: Negative for dysuria. Musculoskeletal: Negative lower extremity swelling Skin: Negative for rash. Neurological: Negative for severe headaches, focal weakness or numbness.   ____________________________________________   PHYSICAL EXAM:  VITAL SIGNS: ED Triage Vitals  Enc Vitals Group     BP 08/13/17 0815 125/76     Pulse Rate 08/13/17 0815 92     Resp 08/13/17 0815 18     Temp 08/13/17 0815 98.6 F (37 C)     Temp Source 08/13/17 0815 Oral     SpO2 08/13/17 0815 99 %     Weight 08/13/17 0815 179 lb (81.2 kg)     Height 08/13/17 0815 5' (1.524 m)     Head Circumference --      Peak Flow --      Pain Score 08/13/17 0820 7     Pain Loc --      Pain Edu? --      Excl. in GC? --     Constitutional: Alert and oriented. Well appearing and in no acute distress. Eyes: Conjunctivae are normal Head: Atraumatic HEENT: No congestion/rhinnorhea. Mucous membranes are moist.  Oropharynx non-erythematous Neck:   Nontender with no meningismus, no masses, no stridor Cardiovascular: Normal rate, regular rhythm. Grossly normal heart sounds.  Good peripheral circulation. Respiratory: Normal respiratory effort.  No retractions. Lungs CTAB. Abdominal: Soft and very slight suprapubic tenderness. No distention. No guarding no rebound Back:  There is no focal tenderness or step off.  there is no midline tenderness there are no lesions noted. there is no CVA tenderness Pelvic exam: Female nurse chaperone present, no external lesions noted, slightly brown brown tinged vaginal discharge noted with no purulent discharge, no cervical motion tenderness, no adnexal tenderness or mass, there is no significant uterine tenderness or mass. No active vaginal bleeding Musculoskeletal: No lower extremity tenderness, no upper extremity  tenderness. No joint effusions, no DVT signs strong distal pulses no edema Neurologic:  Normal speech and language. No gross focal neurologic deficits are appreciated.  Skin:  Skin is warm, dry and intact. No rash noted. Psychiatric: Mood and affect are normal. Speech and behavior are normal.  ____________________________________________   LABS (all labs ordered are listed, but only abnormal results are displayed)  Labs Reviewed  WET PREP, GENITAL - Abnormal; Notable for the following components:      Result Value   Trich, Wet Prep PRESENT (*)    Clue Cells Wet Prep HPF POC PRESENT (*)    WBC, Wet Prep HPF POC MANY (*)    All other components within normal limits  URINALYSIS, COMPLETE (UACMP) WITH MICROSCOPIC - Abnormal; Notable for the following components:   Color, Urine STRAW (*)    APPearance HAZY (*)    Glucose, UA >=500 (*)    Hgb urine dipstick MODERATE (*)    Leukocytes, UA SMALL (*)  Bacteria, UA RARE (*)    Squamous Epithelial / LPF 0-5 (*)    All other components within normal limits  CBC WITH DIFFERENTIAL/PLATELET - Abnormal; Notable for the following components:   Neutro Abs 7.3 (*)    All other components within normal limits  COMPREHENSIVE METABOLIC PANEL - Abnormal; Notable for the following components:   Sodium 134 (*)    Chloride 100 (*)    Glucose, Bld 342 (*)    AST 14 (*)    ALT 13 (*)    All other components within normal limits  URINE CULTURE  CHLAMYDIA/NGC RT PCR ([facility] ONLY)  LIPASE, BLOOD  PREGNANCY, URINE    Pertinent labs  results that were available during my care of the patient were reviewed by me and considered in my medical decision making (see chart for details). ____________________________________________  EKG  I personally interpreted any EKGs ordered by me or triage  ____________________________________________  RADIOLOGY  Pertinent labs & imaging results that were available during my care of the patient were  reviewed by me and considered in my medical decision making (see chart for details). If possible, patient and/or family made aware of any abnormal findings.  No results found. ____________________________________________    PROCEDURES  Procedure(s) performed: None  Procedures  Critical Care performed: None  ____________________________________________   INITIAL IMPRESSION / ASSESSMENT AND PLAN / ED COURSE  Pertinent labs & imaging results that were available during my care of the patient were reviewed by me and considered in my medical decision making (see chart for details).  Patient here with cramping abdominal discomfort and some bleeding in between her menses.  She is in no acute distress, not requesting pain medication, mostly just curious about what is going on.  Will obtain ultrasound as a precaution, no evidence of TOA , appendicitis, diverticulitis or any other acute intra-abdominal pathology today.  ----------------------------------------- 12:57 PM on 08/13/2017 ----------------------------------------- Recent with trichomonas, given her brownish discharge, we will give her appropriate antibiotics here, I will give her azithromycin, and Flagyl.  She is allergic to Rocephin we will defer that I will send her home with Doxy and Flagyl.  Return precautions follow-up given and understood.  Abdomen remains benign.  Patient in no acute distress.  She says she must have sexual partners treated prior to resumption of sexual activity.     ____________________________________________   FINAL CLINICAL IMPRESSION(S) / ED DIAGNOSES  Final diagnoses:  Pelvic pain      This chart was dictated using voice recognition software.  Despite best efforts to proofread,  errors can occur which can change meaning.      Jeanmarie PlantMcShane, James A, MD 08/13/17 1220    Jeanmarie PlantMcShane, James A, MD 08/13/17 1258

## 2017-08-13 NOTE — ED Notes (Signed)
Lab notified for add on Urine Culture and Urine Preg.

## 2017-08-13 NOTE — ED Triage Notes (Signed)
Pt with abd pain started 2-3 days ago with some cold chills and nausea.

## 2017-08-13 NOTE — ED Notes (Signed)
Pelvic Cart set up at bedside; EDP aware.

## 2017-08-13 NOTE — ED Notes (Signed)
Patient transported to Ultrasound 

## 2017-08-13 NOTE — ED Notes (Signed)
FIRST NURSE NOTE: Pt to ER via POV ambulatory c/o vaginal bleeding and mild abdominal pain. Pt offered wheelchair, denies. Pt ambulates safely.

## 2017-08-15 LAB — URINE CULTURE: Culture: 80000 — AB

## 2017-08-16 NOTE — Progress Notes (Signed)
Lucita Ferraraaniqca Hickson is a 32 year old female recently seen @ [facility] ED with chief complaint of pelvic pain.  Urine cultures resulted as the following:    Urine culture [[id number]] (Abnormal)  Collected: 08/13/17 0817  Order Status: Completed Specimen: Urine, Random Updated: 08/15/17 0929   Specimen Description --   URINE, RANDOM  Performed at [facility], [address] [place], [place] [address]    Special Requests --   NONE  Performed at [facility], [address] [place], [place] [address]    Culture 80,000 COLONIES/mL KLEBSIELLA PNEUMONIAE Abnormal    Report Status 08/15/2017 FINAL   Organism ID, Bacteria KLEBSIELLA PNEUMONIAE Abnormal   Susceptibility   Klebsiella pneumoniae ([id number])   Antibiotic Interpretation Microscan Method Status   AMPICILLIN Resistant RESISTANT MIC Final   CEFAZOLIN Sensitive <=4 SENSITIVE MIC Final   CEFTRIAXONE Sensitive <=1 SENSITIVE MIC Final   CIPROFLOXACIN Sensitive <=0.25 SENSITIVE MIC Final   GENTAMICIN Sensitive <=1 SENSITIVE MIC Final   IMIPENEM Sensitive <=0.25 SENSITIVE MIC Final   NITROFURANTOIN Sensitive 32 SENSITIVE MIC Final   TRIMETH/SULFA Sensitive <=20 SENSITIVE MIC Final   AMPICILLIN/SULBACTAM Sensitive <=2 SENSITIVE MIC Final   PIP/TAZO Sensitive <=4 SENSITIVE MIC Final   Extended ESBL Sensitive NEGATIVE MIC Final   Susceptibility Comments   80,000 COLONIES/mL KLEBSIELLA PNEUMONIAE       MD Malinda would like to start Macrobid 100 mg PO BID x 5 days.  Attempted calling the number on file; however unable to reach patient. VM was left asking patient to call Pharmacy back at earliest convenience.    Demetrius Charityeldrin D. Elnor Renovato, PharmD

## 2017-10-19 IMAGING — CR DG CHEST 2V
2 series · 2 of 2 positions shown · non-contrast
Comparison: 11/05/2013

CLINICAL DATA: Fever for 1 day.

EXAM:
CHEST  2 VIEW

[chest pa]
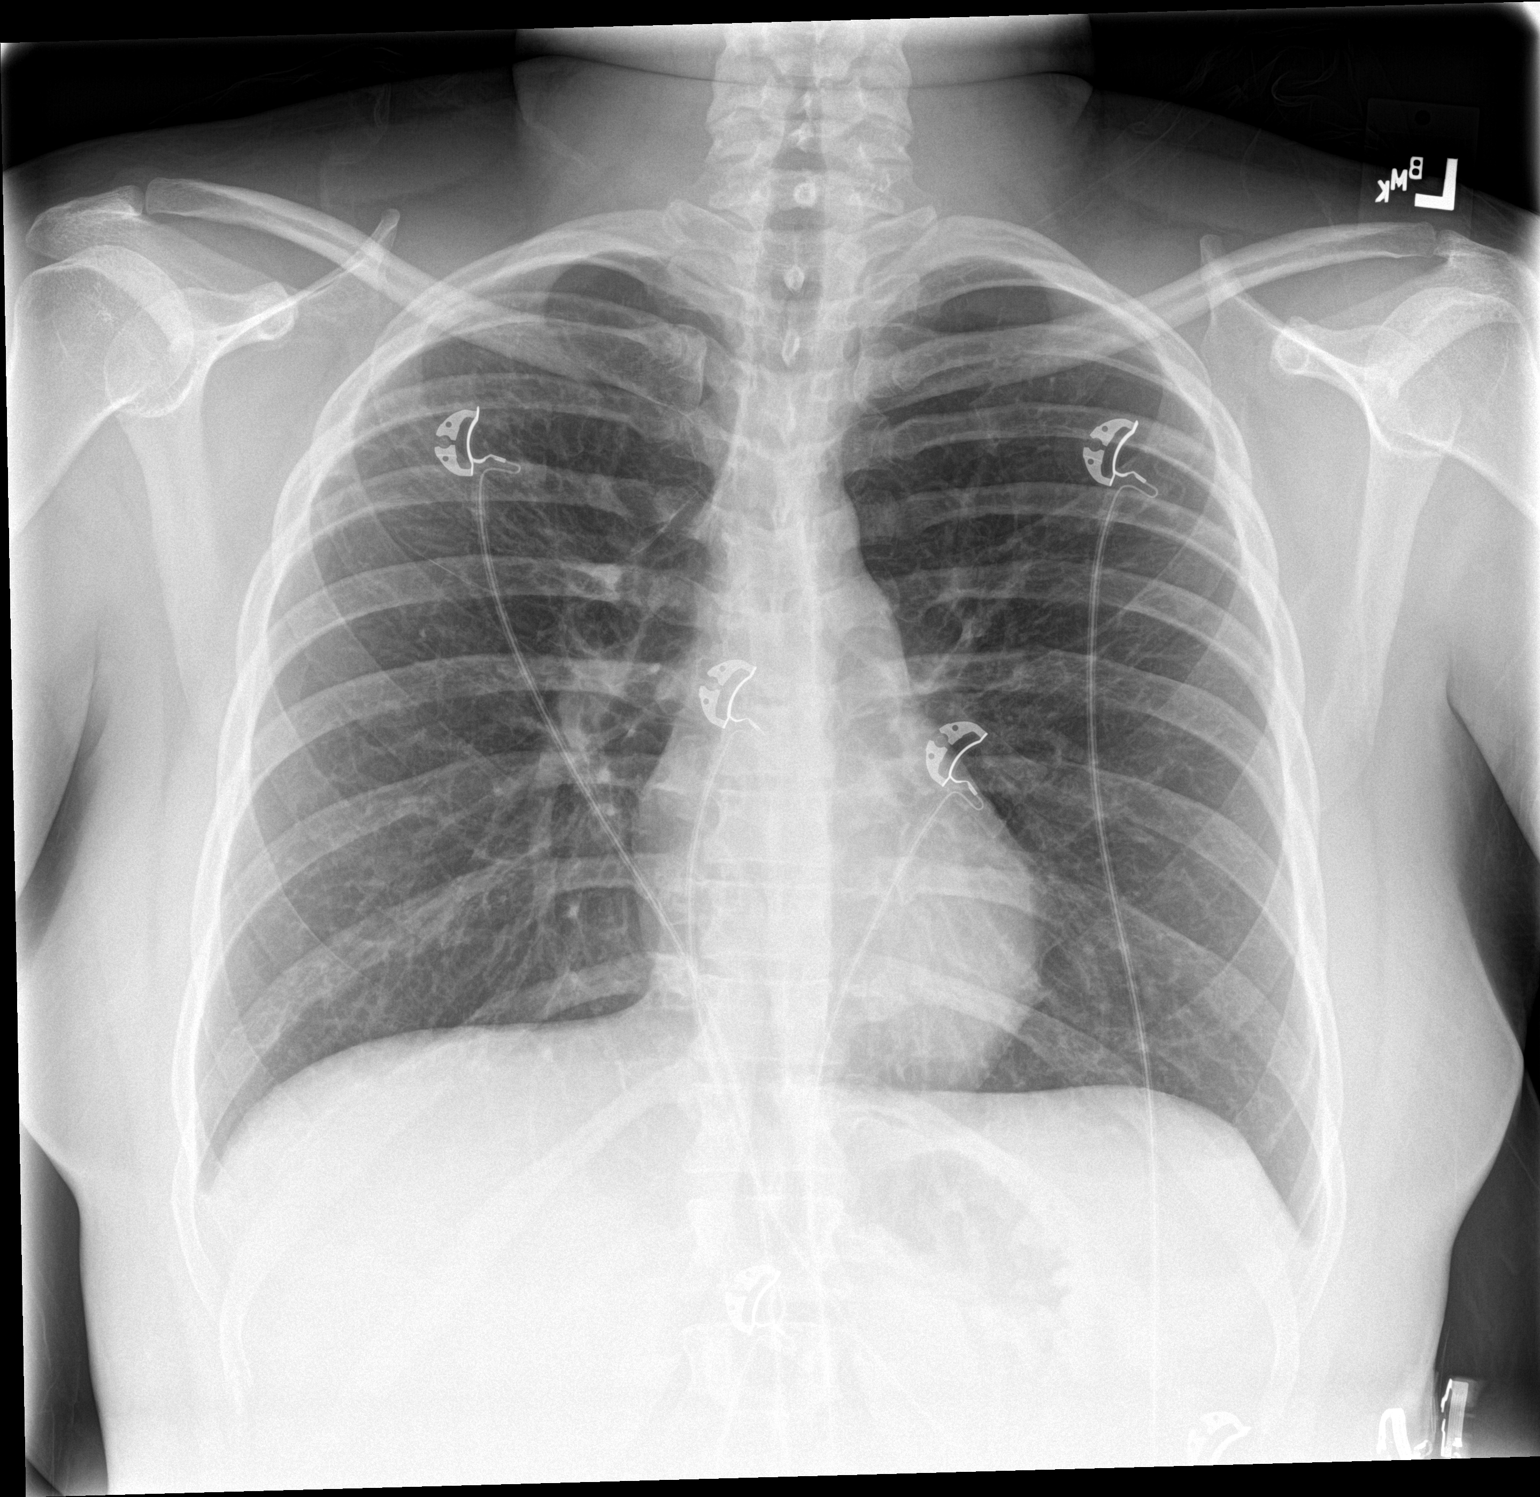

[chest lat]
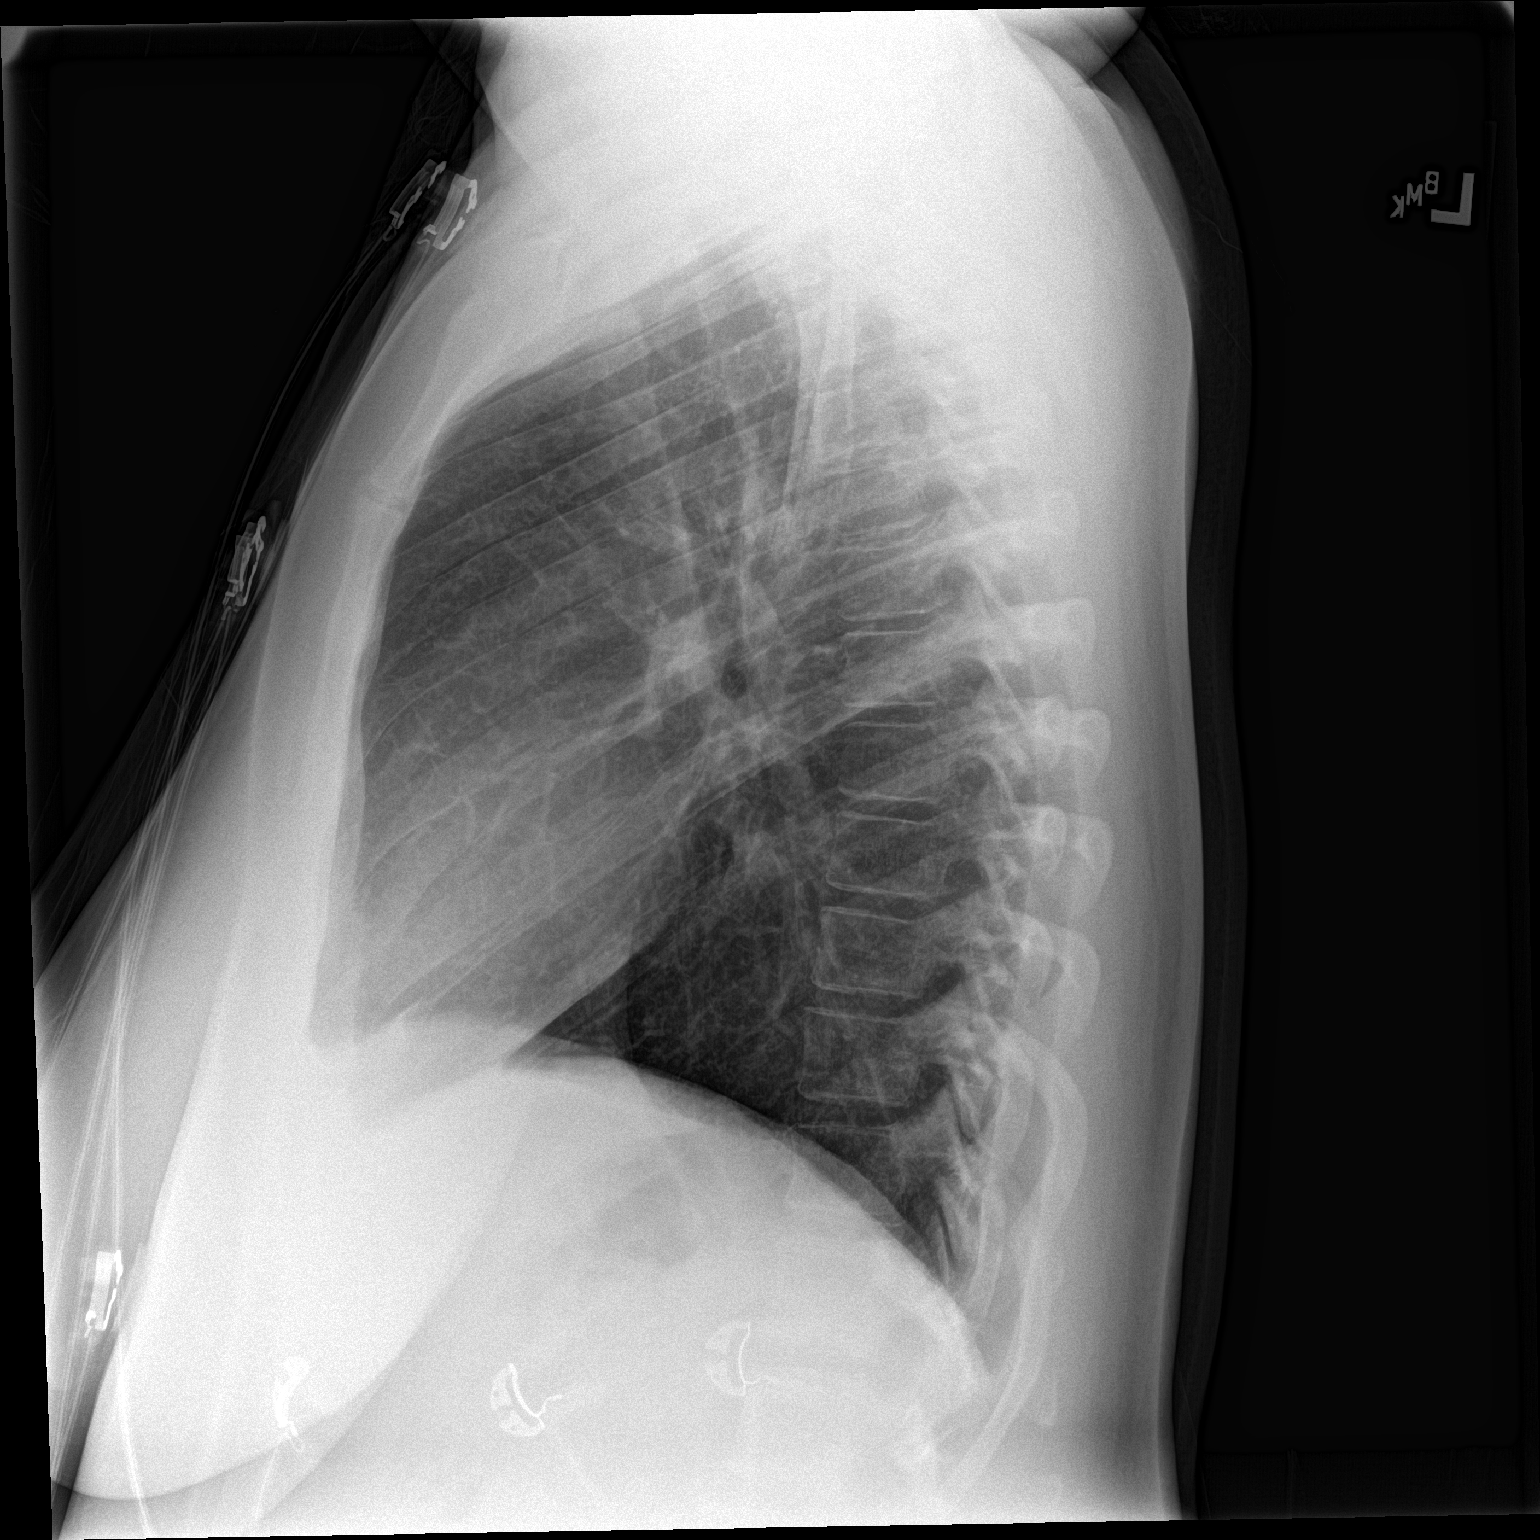

[2 of 2 positions shown; findings below may reference images not displayed]

FINDINGS: The cardiomediastinal contours are normal. Mild right upper lobe and
perihilar scarring is unchanged. No new airspace disease. Pulmonary
vasculature is normal. No consolidation, pleural effusion, or
pneumothorax. No acute osseous abnormalities are seen.
IMPRESSION: Right upper lobe and perihilar scarring.  No acute process.

## 2018-01-22 ENCOUNTER — Encounter: Payer: Self-pay | Admitting: Emergency Medicine

## 2018-01-22 ENCOUNTER — Emergency Department
Admission: EM | Admit: 2018-01-22 | Discharge: 2018-01-22 | Disposition: A | Discharge status: Home / Self Care | Payer: Medicaid Other | Attending: Emergency Medicine | Admitting: Emergency Medicine

## 2018-01-22 ENCOUNTER — Other Ambulatory Visit: Payer: Self-pay

## 2018-01-22 DIAGNOSIS — E119 Type 2 diabetes mellitus without complications: Secondary | ICD-10-CM | POA: Insufficient documentation

## 2018-01-22 DIAGNOSIS — B9689 Other specified bacterial agents as the cause of diseases classified elsewhere: Secondary | ICD-10-CM

## 2018-01-22 DIAGNOSIS — F17210 Nicotine dependence, cigarettes, uncomplicated: Secondary | ICD-10-CM | POA: Insufficient documentation

## 2018-01-22 DIAGNOSIS — N3090 Cystitis, unspecified without hematuria: Secondary | ICD-10-CM | POA: Insufficient documentation

## 2018-01-22 DIAGNOSIS — N760 Acute vaginitis: Secondary | ICD-10-CM | POA: Insufficient documentation

## 2018-01-22 DIAGNOSIS — Z79899 Other long term (current) drug therapy: Secondary | ICD-10-CM | POA: Insufficient documentation

## 2018-01-22 DIAGNOSIS — R1030 Lower abdominal pain, unspecified: Secondary | ICD-10-CM

## 2018-01-22 LAB — BASIC METABOLIC PANEL
ANION GAP: 9 (ref 5–15)
BUN: 11 mg/dL (ref 6–20)
CALCIUM: 9.5 mg/dL (ref 8.9–10.3)
CO2: 25 mmol/L (ref 22–32)
Chloride: 103 mmol/L (ref 98–111)
Creatinine, Ser: 0.83 mg/dL (ref 0.44–1.00)
Glucose, Bld: 325 mg/dL — ABNORMAL HIGH (ref 70–99)
POTASSIUM: 4.2 mmol/L (ref 3.5–5.1)
Sodium: 137 mmol/L (ref 135–145)

## 2018-01-22 LAB — CBC WITH DIFFERENTIAL/PLATELET
BASOS ABS: 0 10*3/uL (ref 0–0.1)
BASOS PCT: 1 %
Eosinophils Absolute: 0.1 10*3/uL (ref 0–0.7)
Eosinophils Relative: 1 %
HEMATOCRIT: 42.5 % (ref 35.0–47.0)
HEMOGLOBIN: 14.7 g/dL (ref 12.0–16.0)
LYMPHS PCT: 15 %
Lymphs Abs: 1.4 10*3/uL (ref 1.0–3.6)
MCH: 30.5 pg (ref 26.0–34.0)
MCHC: 34.6 g/dL (ref 32.0–36.0)
MCV: 88 fL (ref 80.0–100.0)
Monocytes Absolute: 0.6 10*3/uL (ref 0.2–0.9)
Monocytes Relative: 6 %
NEUTROS ABS: 7.6 10*3/uL — AB (ref 1.4–6.5)
NEUTROS PCT: 77 %
Platelets: 252 10*3/uL (ref 150–440)
RBC: 4.83 MIL/uL (ref 3.80–5.20)
RDW: 14.4 % (ref 11.5–14.5)
WBC: 9.8 10*3/uL (ref 3.6–11.0)

## 2018-01-22 LAB — URINALYSIS, COMPLETE (UACMP) WITH MICROSCOPIC
BILIRUBIN URINE: NEGATIVE
Glucose, UA: >=500 mg/dL — AB
Hgb urine dipstick: NEGATIVE
Ketones, ur: 20 mg/dL — AB
NITRITE: POSITIVE — AB
Protein, ur: NEGATIVE mg/dL
SPECIFIC GRAVITY, URINE: 1.018 (ref 1.005–1.030)
pH: 7 (ref 5.0–8.0)

## 2018-01-22 LAB — CHLAMYDIA/NGC RT PCR (ARMC ONLY)
Chlamydia Tr: NOT DETECTED
N gonorrhoeae: NOT DETECTED

## 2018-01-22 LAB — WET PREP, GENITAL
SPERM: NONE SEEN
TRICH WET PREP: NONE SEEN
YEAST WET PREP: NONE SEEN

## 2018-01-22 LAB — GLUCOSE, CAPILLARY: GLUCOSE-CAPILLARY: 346 mg/dL — AB (ref 70–99)

## 2018-01-22 LAB — POCT PREGNANCY, URINE: Preg Test, Ur: NEGATIVE

## 2018-01-22 MED ORDER — METRONIDAZOLE 500 MG PO TABS: 500.0000 mg | ORAL_TABLET | Freq: Two times a day (BID) | ORAL | 0 refills | Status: DC

## 2018-01-22 MED ORDER — NITROFURANTOIN MACROCRYSTAL 100 MG PO CAPS: 100.0000 mg | ORAL_CAPSULE | Freq: Two times a day (BID) | ORAL | 0 refills | Status: DC

## 2018-01-22 MED ORDER — SODIUM CHLORIDE 0.9 % IV BOLUS
1000.0000 mL | Freq: Once | INTRAVENOUS | Status: AC
  Administered 2018-01-22: 1000 mL via INTRAVENOUS

## 2018-01-22 MED ORDER — ONDANSETRON 4 MG PO TBDP: 4.0000 mg | ORAL_TABLET | Freq: Three times a day (TID) | ORAL | 0 refills | Status: DC | PRN

## 2018-01-22 NOTE — ED Notes (Signed)
Pelvic cart at bedside. 

## 2018-01-22 NOTE — ED Provider Notes (Signed)
[facility] Emergency Department Provider Note  ____________________________________________  Time seen: Approximately 12:57 PM  I have reviewed the triage vital signs and the nursing notes.   HISTORY  Chief Complaint Abdominal Pain    HPI Victoria Cortez is a 32 y.o. female with a history of diabetes, sickle cell trait, trach, BV, UTI who complains of diffuse lower abdominal pain , sharp, intermittent, no aggravating or alleviating factors, present for the past 2 days.  No vomiting diarrhea fevers chills.  Has been sexually active but uses condoms always.   Past Medical History:  Diagnosis Date  . Diabetes mellitus without complication (HCC)   . Sickle cell trait [facility])      Patient Active Problem List   Diagnosis Date Noted  . Bacteremia 11/29/2015  . H/O cesarean section 02/03/2015  . S/P cesarean section 02/03/2015  . Uncontrolled diabetes mellitus (HCC) 01/12/2015  . Labor and delivery, indication for care 01/09/2015  . Pregnancy 11/26/2014     Past Surgical History:  Procedure Laterality Date  . CESAREAN SECTION    . CESAREAN SECTION WITH BILATERAL TUBAL LIGATION N/A 02/03/2015   Procedure: CESAREAN SECTION WITH BILATERAL TUBAL LIGATION;  Surgeon: Christeen Douglas, MD;  Location: [facility] ORS;  Service: Obstetrics;  Laterality: N/A;     Prior to Admission medications   Medication Sig Start Date End Date Taking? Authorizing Provider  amoxicillin (AMOXIL) 500 MG tablet Take 1 tablet (500 mg total) by mouth 2 (two) times daily. Patient not taking: Reported on 08/13/2017 04/19/16   Darci Current, MD  ciprofloxacin (CIPRO) 500 MG tablet Take 1 tablet (500 mg total) by mouth 2 (two) times daily. Patient not taking: Reported on 08/13/2017 11/30/15   Milagros Loll, MD  doxycycline (VIBRAMYCIN) 100 MG capsule Take 1 capsule (100 mg total) by mouth 2 (two) times daily. 08/13/17   Jeanmarie Plant, MD  EPINEPHrine (EPIPEN JR 2-PAK) 0.15 MG/0.3ML  injection Inject 0.3 mLs (0.15 mg total) into the muscle as needed for anaphylaxis. Patient not taking: Reported on 08/13/2017 07/02/17   Darci Current, MD  HYDROcodone-acetaminophen [facility]) 5-325 MG tablet Take 1 tablet by mouth every 6 (six) hours as needed for moderate pain. Patient not taking: Reported on 08/13/2017 11/28/15   Irean Hong, MD  ibuprofen (ADVIL,MOTRIN) 800 MG tablet Take 1 tablet (800 mg total) by mouth every 8 (eight) hours as needed for moderate pain. Patient not taking: Reported on 08/13/2017 11/28/15   Irean Hong, MD  insulin detemir (LEVEMIR) 100 UNIT/ML injection Inject 0.16 mLs (16 Units total) into the skin 2 (two) times daily. In evening and morning. Patient not taking: Reported on 08/13/2017 02/07/15   Christeen Douglas, MD  metroNIDAZOLE (FLAGYL) 500 MG tablet Take 1 tablet (500 mg total) by mouth 2 (two) times daily. 01/22/18   Sharman Cheek, MD  nitrofurantoin (MACRODANTIN) 100 MG capsule Take 1 capsule (100 mg total) by mouth 2 (two) times daily. 01/22/18   Sharman Cheek, MD  ondansetron (ZOFRAN ODT) 4 MG disintegrating tablet Take 1 tablet (4 mg total) by mouth every 8 (eight) hours as needed for nausea or vomiting. 01/22/18   Sharman Cheek, MD     Allergies Rocephin [ceftriaxone]   Family History  Problem Relation Age of Onset  . Sickle cell anemia Unknown     Social History Social History   Tobacco Use  . Smoking status: Current Every Day Smoker    Packs/day: 1.00    Types: Cigarettes  . Smokeless tobacco:  Never Used  Substance Use Topics  . Alcohol use: Yes    Alcohol/week: 0.0 oz    Comment: occ  . Drug use: No    Review of Systems  Constitutional:   No fever or chills.   Cardiovascular:   No chest pain or syncope. Respiratory:   No dyspnea or cough. Gastrointestinal:   As above for abdominal pain without vomiting and diarrhea.  Musculoskeletal:   Negative for focal pain or swelling All other systems reviewed and are negative  except as documented above in ROS and HPI.  ____________________________________________   PHYSICAL EXAM:  VITAL SIGNS: ED Triage Vitals [01/22/18 0831]  Enc Vitals Group     BP      Pulse Rate 99     Resp 16     Temp 98.7 F (37.1 C)     Temp Source Oral     SpO2 99 %     Weight 175 lb (79.4 kg)     Height 5' (1.524 m)     Head Circumference      Peak Flow      Pain Score 8     Pain Loc      Pain Edu?      Excl. in GC?     Vital signs reviewed, nursing assessments reviewed.   Constitutional:   Alert and oriented. Non-toxic appearance. Eyes:   Conjunctivae are normal. EOMI. PERRL. ENT      Head:   Normocephalic and atraumatic.      Nose:   No congestion/rhinnorhea.       Mouth/Throat:   MMM, no pharyngeal erythema. No peritonsillar mass.       Neck:   No meningismus. Full ROM. Hematological/Lymphatic/Immunilogical:   No cervical lymphadenopathy. Cardiovascular:   RRR. Symmetric bilateral radial and DP pulses.  No murmurs. Cap refill less than 2 seconds. Respiratory:   Normal respiratory effort without tachypnea/retractions. Breath sounds are clear and equal bilaterally. No wheezes/rales/rhonchi. Gastrointestinal:   Soft with suprapubic tenderness. Non distended. There is no CVA tenderness.  No rebound, rigidity, or guarding. Genitourinary:   External exam unremarkable, speculum exam unremarkable with a slight amount of discharge in the vault.  No CMT no adnexal tenderness or masses.  No evidence of cervicitis.  Exam performed with nurse Lenord Fellers at bedside Musculoskeletal:   Normal range of motion in all extremities. No joint effusions.  No lower extremity tenderness.  No edema. Neurologic:   Normal speech and language.  Motor grossly intact. No acute focal neurologic deficits are appreciated.  Skin:    Skin is warm, dry and intact. No rash noted.  No petechiae, purpura, or bullae.  ____________________________________________    LABS (pertinent  positives/negatives) (all labs ordered are listed, but only abnormal results are displayed) Labs Reviewed  WET PREP, GENITAL - Abnormal; Notable for the following components:      Result Value   Clue Cells Wet Prep HPF POC PRESENT (*)    WBC, Wet Prep HPF POC MODERATE (*)    All other components within normal limits  URINALYSIS, COMPLETE (UACMP) WITH MICROSCOPIC - Abnormal; Notable for the following components:   Color, Urine STRAW (*)    APPearance CLEAR (*)    Glucose, UA >=500 (*)    Ketones, ur 20 (*)    Nitrite POSITIVE (*)    Leukocytes, UA SMALL (*)    Bacteria, UA RARE (*)    All other components within normal limits  GLUCOSE, CAPILLARY - Abnormal; Notable for the following  components:   Glucose-Capillary 346 (*)    All other components within normal limits  BASIC METABOLIC PANEL - Abnormal; Notable for the following components:   Glucose, Bld 325 (*)    All other components within normal limits  CBC WITH DIFFERENTIAL/PLATELET - Abnormal; Notable for the following components:   Neutro Abs 7.6 (*)    All other components within normal limits  CHLAMYDIA/NGC RT PCR ([facility] ONLY)  URINE CULTURE  POC URINE PREG, ED  POCT PREGNANCY, URINE   ____________________________________________   EKG    ____________________________________________    RADIOLOGY  No results found.  ____________________________________________   PROCEDURES Procedures  ____________________________________________    CLINICAL IMPRESSION / ASSESSMENT AND PLAN / ED COURSE  Pertinent labs & imaging results that were available during my care of the patient were reviewed by me and considered in my medical decision making (see chart for details).    Patient presents with diffuse lower abdominal pain.  Concerning for UTI versus STI/BV.  Doubt appendicitis biliary disease bowel perforation obstruction or diverticulitis.  Labs show a urinary tract infection as well as clue cells  indicative of BV.  I will discharge her on Bactrim and Flagyl, follow-up with primary care.  Had a long talk with the patient about her intermittent compliance with her diabetes medications resulting in hyperglycemia and the need to take them regularly to avoid further health problems.      ____________________________________________   FINAL CLINICAL IMPRESSION(S) / ED DIAGNOSES    Final diagnoses:  Lower abdominal pain  Cystitis  Bacterial vaginosis     ED Discharge Orders        Ordered    metroNIDAZOLE (FLAGYL) 500 MG tablet  2 times daily     01/22/18 1255    ondansetron (ZOFRAN ODT) 4 MG disintegrating tablet  Every 8 hours PRN     01/22/18 1255    nitrofurantoin (MACRODANTIN) 100 MG capsule  2 times daily     01/22/18 1255      Portions of this note were generated with dragon dictation software. Dictation errors may occur despite best attempts at proofreading.    Sharman CheekStafford, Sam Wunschel, MD 01/22/18 [phone]

## 2018-01-22 NOTE — ED Notes (Addendum)
Pt c/o lower abdominal pain, doesn't radiated anywhere else; pt states pain gets worst after she eat. Pt states she has been diagnosed with diabetes type II. Pt states "I have metformin and insulin at home but I don't use it and I don't check my blood sugar"  Pt states she has been having vaginal discharge with some spotting since 7/23.

## 2018-01-22 NOTE — ED Triage Notes (Signed)
Pt c/o lower abdominal pain for the past 2 days, which she describes as a sharp pain that is intermittent.  Pt also reports nausea but denies any vomiting or diarrhea.  Pt does report some vaginal discharge that is blood-tinged.

## 2018-01-22 NOTE — ED Notes (Signed)
MD at bedside. 

## 2018-01-22 NOTE — Discharge Instructions (Signed)
Your tests show bacterial vaginosis and a urinary tract infection.

## 2018-01-23 LAB — URINE CULTURE

## 2018-07-09 ENCOUNTER — Other Ambulatory Visit: Payer: Self-pay

## 2018-07-09 ENCOUNTER — Encounter: Payer: Self-pay | Admitting: Emergency Medicine

## 2018-07-09 DIAGNOSIS — Z5321 Procedure and treatment not carried out due to patient leaving prior to being seen by health care provider: Secondary | ICD-10-CM | POA: Insufficient documentation

## 2018-07-09 DIAGNOSIS — F419 Anxiety disorder, unspecified: Secondary | ICD-10-CM | POA: Insufficient documentation

## 2018-07-09 NOTE — ED Triage Notes (Signed)
Pt presents to ED from home via EMS with c/o "anxiety". Pt states she has a hx of the same. Reports she has tingling in her hands and numbness all over.

## 2018-07-09 NOTE — ED Notes (Addendum)
Pt brought in by [facility] for shob, feeling jittery, hx of panic attacks. States feels the same, all vitals were wnl. FSBS 438 pt is noncompliant diabetic.

## 2018-07-10 ENCOUNTER — Emergency Department
Admission: EM | Admit: 2018-07-10 | Discharge: 2018-07-10 | Disposition: A | Discharge status: Home / Self Care | Payer: Self-pay | Attending: Emergency Medicine | Admitting: Emergency Medicine

## 2018-07-10 NOTE — ED Notes (Signed)
No answer when called several times from lobby 

## 2018-08-22 ENCOUNTER — Other Ambulatory Visit: Payer: Self-pay

## 2018-08-22 ENCOUNTER — Emergency Department
Admission: EM | Admit: 2018-08-22 | Discharge: 2018-08-22 | Disposition: A | Discharge status: Home / Self Care | Payer: Medicaid Other | Attending: Emergency Medicine | Admitting: Emergency Medicine

## 2018-08-22 ENCOUNTER — Encounter: Payer: Self-pay | Admitting: Emergency Medicine

## 2018-08-22 DIAGNOSIS — T7840XA Allergy, unspecified, initial encounter: Secondary | ICD-10-CM | POA: Insufficient documentation

## 2018-08-22 DIAGNOSIS — L509 Urticaria, unspecified: Secondary | ICD-10-CM

## 2018-08-22 DIAGNOSIS — E119 Type 2 diabetes mellitus without complications: Secondary | ICD-10-CM | POA: Insufficient documentation

## 2018-08-22 DIAGNOSIS — F17210 Nicotine dependence, cigarettes, uncomplicated: Secondary | ICD-10-CM | POA: Insufficient documentation

## 2018-08-22 MED ORDER — PREDNISONE 20 MG PO TABS: ORAL_TABLET | ORAL | 0 refills | Status: DC

## 2018-08-22 MED ORDER — EPINEPHRINE 0.3 MG/0.3ML IJ SOAJ: 0.3000 mg | Freq: Once | INTRAMUSCULAR | 0 refills | Status: AC

## 2018-08-22 MED ORDER — DIPHENHYDRAMINE HCL 50 MG/ML IJ SOLN
25.0000 mg | Freq: Once | INTRAMUSCULAR | Status: AC
  Administered 2018-08-22: 25 mg via INTRAVENOUS
  Filled 2018-08-22: qty 1

## 2018-08-22 MED ORDER — FAMOTIDINE 20 MG PO TABS: 20.0000 mg | ORAL_TABLET | Freq: Two times a day (BID) | ORAL | 0 refills | Status: DC

## 2018-08-22 MED ORDER — FAMOTIDINE IN NACL 20-0.9 MG/50ML-% IV SOLN
20.0000 mg | Freq: Once | INTRAVENOUS | Status: AC
  Administered 2018-08-22: 20 mg via INTRAVENOUS
  Filled 2018-08-22: qty 50

## 2018-08-22 MED ORDER — METHYLPREDNISOLONE SODIUM SUCC 125 MG IJ SOLR
125.0000 mg | Freq: Once | INTRAMUSCULAR | Status: AC
  Administered 2018-08-22: 125 mg via INTRAVENOUS
  Filled 2018-08-22: qty 2

## 2018-08-22 MED ORDER — SODIUM CHLORIDE 0.9 % IV BOLUS
1000.0000 mL | Freq: Once | INTRAVENOUS | Status: AC
  Administered 2018-08-22: 1000 mL via INTRAVENOUS

## 2018-08-22 NOTE — Discharge Instructions (Signed)
1. Take the following medicines for the next 4 days: Prednisone 60mg daily Pepcid 20mg twice daily 2. Take Benadryl as needed for itching. 3. Use Epi-Pen in case of acute, life-threatening allergic reaction. 4. Return to the ER for worsening symptoms, persistent vomiting, difficulty breathing or other concerns.  

## 2018-08-22 NOTE — ED Triage Notes (Signed)
Patient presents to Emergency Department via [facility] from home with complaints of allergic reaction.  Pt reports eating [employer] hamberger and fries, pt vomited afterwards, pt took 50 mg Benadryl at 2352.    Pt reports hx of reaction prior to something unknown that was more serious with throat swelling.  Pt appears itching and red in face and trunk.

## 2018-08-22 NOTE — ED Provider Notes (Signed)
[facility] Emergency Department Provider Note   ____________________________________________   First MD Initiated Contact with Patient 08/22/18 0100     (approximate)  I have reviewed the triage vital signs and the nursing notes.   HISTORY  Chief Complaint Allergic Reaction    HPI Victoria Cortez is a 33 y.o. female brought to the ED from home via EMS with a chief complaint of allergic reaction.  Patient has had a history of allergic reactions in the past.  States she ate some fast food tonight and then had diffuse urticaria.  Took 50 mg oral Benadryl around midnight.  Denies new medications, prescription or over-the-counter, new foods or exposures.  States the last time this happened she was given a prescription for EpiPen but it was the Junior version and pharmacy would not dispense it.  Denies sensation of throat constriction, tongue or facial swelling.  Denies drooling, chest pain, shortness of breath, abdominal pain, nausea, vomiting or diarrhea.      Past Medical History:  Diagnosis Date  . Diabetes mellitus without complication (HCC)   . Sickle cell trait [facility])     Patient Active Problem List   Diagnosis Date Noted  . Bacteremia 11/29/2015  . H/O cesarean section 02/03/2015  . S/P cesarean section 02/03/2015  . Uncontrolled diabetes mellitus (HCC) 01/12/2015  . Labor and delivery, indication for care 01/09/2015  . Pregnancy 11/26/2014    Past Surgical History:  Procedure Laterality Date  . CESAREAN SECTION    . CESAREAN SECTION WITH BILATERAL TUBAL LIGATION N/A 02/03/2015   Procedure: CESAREAN SECTION WITH BILATERAL TUBAL LIGATION;  Surgeon: Christeen Douglas, MD;  Location: [facility] ORS;  Service: Obstetrics;  Laterality: N/A;    Prior to Admission medications   Medication Sig Start Date End Date Taking? Authorizing Provider  amoxicillin (AMOXIL) 500 MG tablet Take 1 tablet (500 mg total) by mouth 2 (two) times daily. Patient not taking:  Reported on 08/13/2017 04/19/16   Darci Current, MD  ciprofloxacin (CIPRO) 500 MG tablet Take 1 tablet (500 mg total) by mouth 2 (two) times daily. Patient not taking: Reported on 08/13/2017 11/30/15   Milagros Loll, MD  doxycycline (VIBRAMYCIN) 100 MG capsule Take 1 capsule (100 mg total) by mouth 2 (two) times daily. 08/13/17   Jeanmarie Plant, MD  EPINEPHrine (EPIPEN JR 2-PAK) 0.15 MG/0.3ML injection Inject 0.3 mLs (0.15 mg total) into the muscle as needed for anaphylaxis. Patient not taking: Reported on 08/13/2017 07/02/17   Darci Current, MD  HYDROcodone-acetaminophen [facility]) 5-325 MG tablet Take 1 tablet by mouth every 6 (six) hours as needed for moderate pain. Patient not taking: Reported on 08/13/2017 11/28/15   Irean Hong, MD  ibuprofen (ADVIL,MOTRIN) 800 MG tablet Take 1 tablet (800 mg total) by mouth every 8 (eight) hours as needed for moderate pain. Patient not taking: Reported on 08/13/2017 11/28/15   Irean Hong, MD  insulin detemir (LEVEMIR) 100 UNIT/ML injection Inject 0.16 mLs (16 Units total) into the skin 2 (two) times daily. In evening and morning. Patient not taking: Reported on 08/13/2017 02/07/15   Christeen Douglas, MD  metroNIDAZOLE (FLAGYL) 500 MG tablet Take 1 tablet (500 mg total) by mouth 2 (two) times daily. 01/22/18   Sharman Cheek, MD  nitrofurantoin (MACRODANTIN) 100 MG capsule Take 1 capsule (100 mg total) by mouth 2 (two) times daily. 01/22/18   Sharman Cheek, MD  ondansetron (ZOFRAN ODT) 4 MG disintegrating tablet Take 1 tablet (4 mg total) by mouth  every 8 (eight) hours as needed for nausea or vomiting. 01/22/18   Sharman Cheek, MD    Allergies Rocephin [ceftriaxone]  Family History  Problem Relation Age of Onset  . Sickle cell anemia Other     Social History Social History   Tobacco Use  . Smoking status: Current Every Day Smoker    Packs/day: 1.00    Types: Cigarettes  . Smokeless tobacco: Never Used  Substance Use Topics  . Alcohol use:  Yes    Alcohol/week: 0.0 standard drinks    Comment: occ  . Drug use: No    Review of Systems  Constitutional: No fever/chills Eyes: No visual changes. ENT: No sore throat. Cardiovascular: Denies chest pain. Respiratory: Denies shortness of breath. Gastrointestinal: No abdominal pain.  No nausea, no vomiting.  No diarrhea.  No constipation. Genitourinary: Negative for dysuria. Musculoskeletal: Negative for back pain. Skin: Positive for rash. Neurological: Negative for headaches, focal weakness or numbness.   ____________________________________________   PHYSICAL EXAM:  VITAL SIGNS: ED Triage Vitals  Enc Vitals Group     BP      Pulse      Resp      Temp      Temp src      SpO2      Weight      Height      Head Circumference      Peak Flow      Pain Score      Pain Loc      Pain Edu?      Excl. in GC?     Constitutional: Alert and oriented. Well appearing and in mild acute distress. Eyes: Conjunctivae are normal. PERRL. EOMI. Head: Atraumatic. Nose: No congestion/rhinnorhea. Mouth/Throat: Mucous membranes are moist.  There is no tongue or lip angioedema.  There is no hoarse or muffled voice.  There is no drooling. Neck: No stridor.  Soft submental space. Cardiovascular: Normal rate, regular rhythm. Grossly normal heart sounds.  Good peripheral circulation. Respiratory: Normal respiratory effort.  No retractions. Lungs CTAB. Gastrointestinal: Soft and nontender. No distention. No abdominal bruits. No CVA tenderness. Musculoskeletal: No lower extremity tenderness nor edema.  No joint effusions. Neurologic:  Normal speech and language. No gross focal neurologic deficits are appreciated. No gait instability. Skin:  Skin is warm, dry and intact.  Diffuse urticaria noted. Psychiatric: Mood and affect are normal. Speech and behavior are normal.  ____________________________________________   LABS (all labs ordered are listed, but only abnormal results are  displayed)  Labs Reviewed - No data to display ____________________________________________  EKG  None ____________________________________________  RADIOLOGY  ED MD interpretation: None  Official radiology report(s): No results found.  ____________________________________________   PROCEDURES  Procedure(s) performed: None  Procedures  Critical Care performed: Yes, see critical care note(s)   CRITICAL CARE Performed by: Irean Hong   Total critical care time: 30 minutes  Critical care time was exclusive of separately billable procedures and treating other patients.  Critical care was necessary to treat or prevent imminent or life-threatening deterioration.  Critical care was time spent personally by me on the following activities: development of treatment plan with patient and/or surrogate as well as nursing, discussions with consultants, evaluation of patient's response to treatment, examination of patient, obtaining history from patient or surrogate, ordering and performing treatments and interventions, ordering and review of laboratory studies, ordering and review of radiographic studies, pulse oximetry and re-evaluation of patient's condition.  ____________________________________________   INITIAL IMPRESSION / ASSESSMENT AND PLAN /  ED COURSE  As part of my medical decision making, I reviewed the following data within the electronic MEDICAL RECORD NUMBER Nursing notes reviewed and incorporated, Old chart reviewed and Notes from prior ED visits    33 year old female who presents with diffuse urticaria.  Will administer 125 mg IV Solu-Medrol, 25 mg IV Benadryl, 20 mg IV Pepcid, 1 L normal saline and reassess.   Clinical Course as of Aug 22 336  Sat Aug 22, 2018  [id number] Hives resolved.  Will discharge home on Prednisone, Pepcid and encouraged patient to take Benadryl as needed.  Will refer for allergy testing.  Strict return precautions given.  Patient verbalizes  understanding and agrees with plan of care.   [JS]    Clinical Course User Index [JS] Irean Hong, MD     ____________________________________________   FINAL CLINICAL IMPRESSION(S) / ED DIAGNOSES  Final diagnoses:  Allergic reaction, initial encounter  Urticaria     ED Discharge Orders    None       Note:  This document was prepared using Dragon voice recognition software and may include unintentional dictation errors.   Irean Hong, MD 08/22/18 [phone]

## 2018-10-12 ENCOUNTER — Encounter: Payer: Self-pay | Admitting: Emergency Medicine

## 2018-10-12 ENCOUNTER — Emergency Department
Admission: EM | Admit: 2018-10-12 | Discharge: 2018-10-12 | Disposition: A | Discharge status: Home / Self Care | Payer: Medicaid Other | Attending: Emergency Medicine | Admitting: Emergency Medicine

## 2018-10-12 DIAGNOSIS — F17210 Nicotine dependence, cigarettes, uncomplicated: Secondary | ICD-10-CM | POA: Insufficient documentation

## 2018-10-12 DIAGNOSIS — L0291 Cutaneous abscess, unspecified: Secondary | ICD-10-CM

## 2018-10-12 DIAGNOSIS — Z794 Long term (current) use of insulin: Secondary | ICD-10-CM | POA: Insufficient documentation

## 2018-10-12 DIAGNOSIS — E119 Type 2 diabetes mellitus without complications: Secondary | ICD-10-CM | POA: Insufficient documentation

## 2018-10-12 DIAGNOSIS — L02214 Cutaneous abscess of groin: Secondary | ICD-10-CM | POA: Insufficient documentation

## 2018-10-12 DIAGNOSIS — Z79899 Other long term (current) drug therapy: Secondary | ICD-10-CM | POA: Insufficient documentation

## 2018-10-12 MED ORDER — SULFAMETHOXAZOLE-TRIMETHOPRIM 800-160 MG PO TABS
1.0000 | ORAL_TABLET | Freq: Once | ORAL | Status: AC
  Administered 2018-10-12: 21:00:00 1 via ORAL
  Filled 2018-10-12: qty 1

## 2018-10-12 MED ORDER — LIDOCAINE HCL (PF) 1 % IJ SOLN
5.0000 mL | Freq: Once | INTRAMUSCULAR | Status: AC
Start: 2018-10-12 — End: 2018-10-12
  Administered 2018-10-12: 21:00:00 5 mL via INTRADERMAL
  Filled 2018-10-12: qty 5

## 2018-10-12 MED ORDER — SULFAMETHOXAZOLE-TRIMETHOPRIM 800-160 MG PO TABS: 1.0000 | ORAL_TABLET | Freq: Two times a day (BID) | ORAL | 0 refills | Status: DC

## 2018-10-12 NOTE — ED Notes (Signed)
Patient states having an abscess on left pelvic region. Patient just removed fat back from area. Area is red and swollen.

## 2018-10-12 NOTE — ED Triage Notes (Signed)
Pt reports abscess to the left inner thigh x3 days ago, worsening today. Pt has raised area to the inner left thigh that is hard and reddened on assessment with no visible drainage. Pt has fatback in place over area.

## 2018-10-12 NOTE — ED Provider Notes (Signed)
[facility] Emergency Department Provider Note  ____________________________________________  Time seen: Approximately 8:22 PM  I have reviewed the triage vital signs and the nursing notes.   HISTORY  Chief Complaint Abscess    HPI Victoria Cortez is a 33 y.o. female presents emergency department for evaluation of abscess to left groin for 3 days.  No drainage.  Patient has been applying back to area.  She has never had an abscess before.  No fever.   Past Medical History:  Diagnosis Date  . Diabetes mellitus without complication (HCC)   . Sickle cell trait [facility](HCC)     Patient Active Problem List   Diagnosis Date Noted  . Bacteremia 11/29/2015  . H/O cesarean section 02/03/2015  . S/P cesarean section 02/03/2015  . Uncontrolled diabetes mellitus (HCC) 01/12/2015  . Labor and delivery, indication for care 01/09/2015  . Pregnancy 11/26/2014    Past Surgical History:  Procedure Laterality Date  . CESAREAN SECTION    . CESAREAN SECTION WITH BILATERAL TUBAL LIGATION N/A 02/03/2015   Procedure: CESAREAN SECTION WITH BILATERAL TUBAL LIGATION;  Surgeon: Christeen DouglasBethany Beasley, MD;  Location: [facility] ORS;  Service: Obstetrics;  Laterality: N/A;    Prior to Admission medications   Medication Sig Start Date End Date Taking? Authorizing Provider  amoxicillin (AMOXIL) 500 MG tablet Take 1 tablet (500 mg total) by mouth 2 (two) times daily. Patient not taking: Reported on 08/13/2017 04/19/16   Darci CurrentBrown,  N, MD  ciprofloxacin (CIPRO) 500 MG tablet Take 1 tablet (500 mg total) by mouth 2 (two) times daily. Patient not taking: Reported on 08/13/2017 11/30/15   Milagros LollSudini, Srikar, MD  doxycycline (VIBRAMYCIN) 100 MG capsule Take 1 capsule (100 mg total) by mouth 2 (two) times daily. 08/13/17   Jeanmarie PlantMcShane, James A, MD  famotidine (PEPCID) 20 MG tablet Take 1 tablet (20 mg total) by mouth 2 (two) times daily. 08/22/18   Irean HongSung, Jade J, MD  HYDROcodone-acetaminophen (NORCO) 5-325 MG  tablet Take 1 tablet by mouth every 6 (six) hours as needed for moderate pain. Patient not taking: Reported on 08/13/2017 11/28/15   Irean HongSung, Jade J, MD  ibuprofen (ADVIL,MOTRIN) 800 MG tablet Take 1 tablet (800 mg total) by mouth every 8 (eight) hours as needed for moderate pain. Patient not taking: Reported on 08/13/2017 11/28/15   Irean HongSung, Jade J, MD  insulin detemir (LEVEMIR) 100 UNIT/ML injection Inject 0.16 mLs (16 Units total) into the skin 2 (two) times daily. In evening and morning. Patient not taking: Reported on 08/13/2017 02/07/15   Christeen DouglasBeasley, Bethany, MD  metroNIDAZOLE (FLAGYL) 500 MG tablet Take 1 tablet (500 mg total) by mouth 2 (two) times daily. 01/22/18   Sharman CheekStafford, Phillip, MD  nitrofurantoin (MACRODANTIN) 100 MG capsule Take 1 capsule (100 mg total) by mouth 2 (two) times daily. 01/22/18   Sharman CheekStafford, Phillip, MD  ondansetron (ZOFRAN ODT) 4 MG disintegrating tablet Take 1 tablet (4 mg total) by mouth every 8 (eight) hours as needed for nausea or vomiting. 01/22/18   Sharman CheekStafford, Phillip, MD  predniSONE (DELTASONE) 20 MG tablet 3 tablets PO qd x 4 days 08/22/18   Irean HongSung, Jade J, MD  sulfamethoxazole-trimethoprim (BACTRIM DS,SEPTRA DS) 800-160 MG tablet Take 1 tablet by mouth 2 (two) times daily. 10/12/18   Enid DerryWagner, Meloni Hinz, PA-C    Allergies Rocephin [ceftriaxone]  Family History  Problem Relation Age of Onset  . Sickle cell anemia Other     Social History Social History   Tobacco Use  . Smoking status: Current Every  Day Smoker    Packs/day: 1.00    Types: Cigarettes  . Smokeless tobacco: Never Used  Substance Use Topics  . Alcohol use: Yes    Alcohol/week: 0.0 standard drinks    Comment: occ  . Drug use: No     Review of Systems  Constitutional: No fever/chills Gastrointestinal:  No nausea, no vomiting.  Musculoskeletal: Negative for musculoskeletal pain. Skin: Negative for abrasions, lacerations, ecchymosis. Positive for  rash.   ____________________________________________   PHYSICAL EXAM:  VITAL SIGNS: ED Triage Vitals  Enc Vitals Group     BP 10/12/18 1925 128/81     Pulse Rate 10/12/18 1925 99     Resp 10/12/18 1925 18     Temp 10/12/18 1925 98.6 F (37 C)     Temp Source 10/12/18 1925 Oral     SpO2 10/12/18 1925 98 %     Weight --      Height --      Head Circumference --      Peak Flow --      Pain Score 10/12/18 1922 9     Pain Loc --      Pain Edu? --      Excl. in GC? --      Constitutional: Alert and oriented. Well appearing and in no acute distress. Eyes: Conjunctivae are normal. PERRL. EOMI. Head: Atraumatic. ENT:      Ears:      Nose: No congestion/rhinnorhea.      Mouth/Throat: Mucous membranes are moist.  Neck: No stridor. Cardiovascular: Normal rate, regular rhythm.  Good peripheral circulation. Respiratory: Normal respiratory effort without tachypnea or retractions. Lungs CTAB. Good air entry to the bases with no decreased or absent breath sounds. Musculoskeletal: Full range of motion to all extremities. No gross deformities appreciated. Neurologic:  Normal speech and language. No gross focal neurologic deficits are appreciated.  Skin:  Skin is warm, dry. 1cm by 4cm area of swelling and fluctuance to left groin with tenderness, mild surrounding induration and erythema  Psychiatric: Mood and affect are normal. Speech and behavior are normal. Patient exhibits appropriate insight and judgement.   ____________________________________________   LABS (all labs ordered are listed, but only abnormal results are displayed)  Labs Reviewed - No data to display ____________________________________________  EKG   ____________________________________________  RADIOLOGY  No results found.  ____________________________________________    PROCEDURES  Procedure(s) performed:    Procedures  INCISION AND DRAINAGE Performed by: Enid Derry Consent: Verbal  consent obtained. Risks and benefits: risks, benefits and alternatives were discussed Type: abscess  Body area: groin  Anesthesia: local infiltration  Incision was made with a scalpel.  Local anesthetic: lidocaine 1 % without epinephrine  Anesthetic total: 4 ml  Complexity: complex Blunt dissection to break up loculations  Drainage: purulent  Drainage amount: 10ccs   Packing material: 1/4 in iodoform gauze  Patient tolerance: Patient tolerated the procedure well with no immediate complications.    Medications  lidocaine (PF) (XYLOCAINE) 1 % injection 5 mL (has no administration in time range)  sulfamethoxazole-trimethoprim (BACTRIM DS,SEPTRA DS) 800-160 MG per tablet 1 tablet (has no administration in time range)     ____________________________________________   INITIAL IMPRESSION / ASSESSMENT AND PLAN / ED COURSE  Pertinent labs & imaging results that were available during my care of the patient were reviewed by me and considered in my medical decision making (see chart for details).  Review of the [place] CSRS was performed in accordance of the [facility] prior to dispensing  any controlled drugs.     Patient's diagnosis is consistent with abcess.  Abscess was successfully drained and packed in the emergency department.  She was given a dose of antibiotics in the emergency department.  Patient will be discharged home with prescriptions for Bactrim. Patient is to follow up with PCP as directed. Patient is given ED precautions to return to the ED for any worsening or new symptoms.     ____________________________________________  FINAL CLINICAL IMPRESSION(S) / ED DIAGNOSES  Final diagnoses:  Abscess      NEW MEDICATIONS STARTED DURING THIS VISIT:  ED Discharge Orders         Ordered    sulfamethoxazole-trimethoprim (BACTRIM DS,SEPTRA DS) 800-160 MG tablet  2 times daily     10/12/18 2027              This chart was dictated using voice recognition  software/Dragon. Despite best efforts to proofread, errors can occur which can change the meaning. Any change was purely unintentional.    Enid Derry, PA-C 10/12/18 2031    Jeanmarie Plant, MD 10/12/18 2306

## 2018-11-08 ENCOUNTER — Encounter: Payer: Self-pay | Admitting: Emergency Medicine

## 2018-11-08 ENCOUNTER — Emergency Department: Payer: Self-pay

## 2018-11-08 ENCOUNTER — Other Ambulatory Visit: Payer: Self-pay

## 2018-11-08 ENCOUNTER — Emergency Department
Admission: EM | Admit: 2018-11-08 | Discharge: 2018-11-09 | Disposition: A | Discharge status: Home / Self Care | Payer: Self-pay | Attending: Emergency Medicine | Admitting: Emergency Medicine

## 2018-11-08 DIAGNOSIS — F17210 Nicotine dependence, cigarettes, uncomplicated: Secondary | ICD-10-CM | POA: Insufficient documentation

## 2018-11-08 DIAGNOSIS — Z79899 Other long term (current) drug therapy: Secondary | ICD-10-CM | POA: Insufficient documentation

## 2018-11-08 DIAGNOSIS — E1165 Type 2 diabetes mellitus with hyperglycemia: Secondary | ICD-10-CM | POA: Insufficient documentation

## 2018-11-08 DIAGNOSIS — Y9289 Other specified places as the place of occurrence of the external cause: Secondary | ICD-10-CM | POA: Insufficient documentation

## 2018-11-08 DIAGNOSIS — S32401A Unspecified fracture of right acetabulum, initial encounter for closed fracture: Secondary | ICD-10-CM | POA: Insufficient documentation

## 2018-11-08 DIAGNOSIS — Y998 Other external cause status: Secondary | ICD-10-CM | POA: Insufficient documentation

## 2018-11-08 DIAGNOSIS — Y9389 Activity, other specified: Secondary | ICD-10-CM | POA: Insufficient documentation

## 2018-11-08 DIAGNOSIS — S32409A Unspecified fracture of unspecified acetabulum, initial encounter for closed fracture: Secondary | ICD-10-CM

## 2018-11-08 DIAGNOSIS — W010XXA Fall on same level from slipping, tripping and stumbling without subsequent striking against object, initial encounter: Secondary | ICD-10-CM | POA: Insufficient documentation

## 2018-11-08 DIAGNOSIS — R739 Hyperglycemia, unspecified: Secondary | ICD-10-CM

## 2018-11-08 LAB — CBC WITH DIFFERENTIAL/PLATELET
Abs Immature Granulocytes: 0.07 10*3/uL (ref 0.00–0.07)
Basophils Absolute: 0 10*3/uL (ref 0.0–0.1)
Basophils Relative: 0 %
Eosinophils Absolute: 0.1 10*3/uL (ref 0.0–0.5)
Eosinophils Relative: 1 %
HCT: 40.1 % (ref 36.0–46.0)
Hemoglobin: 14 g/dL (ref 12.0–15.0)
Immature Granulocytes: 1 %
Lymphocytes Relative: 22 %
Lymphs Abs: 2.7 10*3/uL (ref 0.7–4.0)
MCH: 30.1 pg (ref 26.0–34.0)
MCHC: 34.9 g/dL (ref 30.0–36.0)
MCV: 86.2 fL (ref 80.0–100.0)
Monocytes Absolute: 0.8 10*3/uL (ref 0.1–1.0)
Monocytes Relative: 7 %
Neutro Abs: 8.3 10*3/uL — ABNORMAL HIGH (ref 1.7–7.7)
Neutrophils Relative %: 69 %
Platelets: 248 10*3/uL (ref 150–400)
RBC: 4.65 MIL/uL (ref 3.87–5.11)
RDW: 13.4 % (ref 11.5–15.5)
WBC: 12 10*3/uL — ABNORMAL HIGH (ref 4.0–10.5)
nRBC: 0 % (ref 0.0–0.2)

## 2018-11-08 LAB — COMPREHENSIVE METABOLIC PANEL
ALT: 14 U/L (ref 0–44)
AST: 13 U/L — ABNORMAL LOW (ref 15–41)
Albumin: 3.9 g/dL (ref 3.5–5.0)
Alkaline Phosphatase: 89 U/L (ref 38–126)
Anion gap: 14 (ref 5–15)
BUN: 8 mg/dL (ref 6–20)
CO2: 21 mmol/L — ABNORMAL LOW (ref 22–32)
Calcium: 8.8 mg/dL — ABNORMAL LOW (ref 8.9–10.3)
Chloride: 97 mmol/L — ABNORMAL LOW (ref 98–111)
Creatinine, Ser: 0.91 mg/dL (ref 0.44–1.00)
GFR calc Af Amer: >60 mL/min (ref >60)
GFR calc non Af Amer: >60 mL/min (ref >60)
Glucose, Bld: 478 mg/dL — ABNORMAL HIGH (ref 70–99)
Potassium: 3.3 mmol/L — ABNORMAL LOW (ref 3.5–5.1)
Sodium: 132 mmol/L — ABNORMAL LOW (ref 135–145)
Total Bilirubin: 0.6 mg/dL (ref 0.3–1.2)
Total Protein: 7.6 g/dL (ref 6.5–8.1)

## 2018-11-08 MED ORDER — SODIUM CHLORIDE 0.9 % IV BOLUS
1000.0000 mL | Freq: Once | INTRAVENOUS | Status: AC
  Administered 2018-11-09: 1000 mL via INTRAVENOUS

## 2018-11-08 MED ORDER — MORPHINE SULFATE (PF) 4 MG/ML IV SOLN
4.0000 mg | Freq: Once | INTRAVENOUS | Status: AC
  Administered 2018-11-08: 4 mg via INTRAVENOUS
  Filled 2018-11-08: qty 1

## 2018-11-08 MED ORDER — SODIUM CHLORIDE 0.9 % IV BOLUS: 1000.0000 mL | Freq: Once | INTRAVENOUS | Status: DC

## 2018-11-08 MED ORDER — SODIUM CHLORIDE 0.9 % IV SOLN
Freq: Once | INTRAVENOUS | Status: AC
  Administered 2018-11-08: 23:00:00 via INTRAVENOUS

## 2018-11-08 MED ORDER — ONDANSETRON HCL 4 MG/2ML IJ SOLN
4.0000 mg | Freq: Once | INTRAMUSCULAR | Status: AC
  Administered 2018-11-08: 4 mg via INTRAVENOUS
  Filled 2018-11-08: qty 2

## 2018-11-08 MED ORDER — OXYCODONE-ACETAMINOPHEN 5-325 MG PO TABS: 1.0000 | ORAL_TABLET | ORAL | 0 refills | Status: AC | PRN

## 2018-11-08 NOTE — ED Provider Notes (Signed)
[facility] Emergency Department Provider Note ____________________________________________   First MD Initiated Contact with Patient 11/08/18 1842     (approximate)  I have reviewed the triage vital signs and the nursing notes.   HISTORY  Chief Complaint Fall    HPI Victoria Cortez is a 33 y.o. female with PMH as noted below who presents with right hip injury, acute onset when she had a mechanical fall from standing height.  The patient states that her knee locked and she suddenly felt a pop in her hip.  She denies hitting the hip directly.  She has been unable to bear weight since then.  She denies any other injuries, or any recent illness or other complaints.  She reports that she has been unable to fill her insulin prescription for the last 2 months due to a problem with her Medicaid.  Past Medical History:  Diagnosis Date  . Diabetes mellitus without complication (HCC)   . Sickle cell trait [facility](HCC)     Patient Active Problem List   Diagnosis Date Noted  . Bacteremia 11/29/2015  . H/O cesarean section 02/03/2015  . S/P cesarean section 02/03/2015  . Uncontrolled diabetes mellitus (HCC) 01/12/2015  . Labor and delivery, indication for care 01/09/2015  . Pregnancy 11/26/2014    Past Surgical History:  Procedure Laterality Date  . CESAREAN SECTION    . CESAREAN SECTION WITH BILATERAL TUBAL LIGATION N/A 02/03/2015   Procedure: CESAREAN SECTION WITH BILATERAL TUBAL LIGATION;  Surgeon: Christeen DouglasBethany Beasley, MD;  Location: [facility] ORS;  Service: Obstetrics;  Laterality: N/A;    Prior to Admission medications   Medication Sig Start Date End Date Taking? Authorizing Provider  amoxicillin (AMOXIL) 500 MG tablet Take 1 tablet (500 mg total) by mouth 2 (two) times daily. Patient not taking: Reported on 08/13/2017 04/19/16   Darci CurrentBrown, Pingree N, MD  ciprofloxacin (CIPRO) 500 MG tablet Take 1 tablet (500 mg total) by mouth 2 (two) times daily. Patient not taking:  Reported on 08/13/2017 11/30/15   Milagros LollSudini, Srikar, MD  doxycycline (VIBRAMYCIN) 100 MG capsule Take 1 capsule (100 mg total) by mouth 2 (two) times daily. Patient not taking: Reported on 11/08/2018 08/13/17   Jeanmarie PlantMcShane, James A, MD  famotidine (PEPCID) 20 MG tablet Take 1 tablet (20 mg total) by mouth 2 (two) times daily. Patient not taking: Reported on 11/08/2018 08/22/18   Irean HongSung, Jade J, MD  HYDROcodone-acetaminophen [facility](NORCO) 5-325 MG tablet Take 1 tablet by mouth every 6 (six) hours as needed for moderate pain. Patient not taking: Reported on 08/13/2017 11/28/15   Irean HongSung, Jade J, MD  ibuprofen (ADVIL,MOTRIN) 800 MG tablet Take 1 tablet (800 mg total) by mouth every 8 (eight) hours as needed for moderate pain. Patient not taking: Reported on 08/13/2017 11/28/15   Irean HongSung, Jade J, MD  insulin detemir (LEVEMIR) 100 UNIT/ML injection Inject 0.16 mLs (16 Units total) into the skin 2 (two) times daily. In evening and morning. Patient not taking: Reported on 08/13/2017 02/07/15   Christeen DouglasBeasley, Bethany, MD  metroNIDAZOLE (FLAGYL) 500 MG tablet Take 1 tablet (500 mg total) by mouth 2 (two) times daily. Patient not taking: Reported on 11/08/2018 01/22/18   Sharman CheekStafford, Phillip, MD  nitrofurantoin (MACRODANTIN) 100 MG capsule Take 1 capsule (100 mg total) by mouth 2 (two) times daily. Patient not taking: Reported on 11/08/2018 01/22/18   Sharman CheekStafford, Phillip, MD  ondansetron (ZOFRAN ODT) 4 MG disintegrating tablet Take 1 tablet (4 mg total) by mouth every 8 (eight) hours as needed for  nausea or vomiting. Patient not taking: Reported on 11/08/2018 01/22/18   Sharman Cheek, MD  predniSONE (DELTASONE) 20 MG tablet 3 tablets PO qd x 4 days Patient not taking: Reported on 11/08/2018 08/22/18   Irean Hong, MD  sulfamethoxazole-trimethoprim (BACTRIM DS,SEPTRA DS) 800-160 MG tablet Take 1 tablet by mouth 2 (two) times daily. Patient not taking: Reported on 11/08/2018 10/12/18   Enid Derry, PA-C    Allergies Rocephin [ceftriaxone]  Family  History  Problem Relation Age of Onset  . Sickle cell anemia Other     Social History Social History   Tobacco Use  . Smoking status: Current Every Day Smoker    Packs/day: 1.00    Types: Cigarettes  . Smokeless tobacco: Never Used  Substance Use Topics  . Alcohol use: Yes    Alcohol/week: 0.0 standard drinks    Comment: drank today  . Drug use: No    Review of Systems  Constitutional: No fever. Eyes: No redness. ENT: No sore throat. Cardiovascular: Denies chest pain. Respiratory: Denies shortness of breath. Gastrointestinal: No vomiting or diarrhea.  Genitourinary: Negative for dysuria.  Musculoskeletal: Positive for right hip injury. Skin: Negative for rash. Neurological: Negative for headache.   ____________________________________________   PHYSICAL EXAM:  VITAL SIGNS: ED Triage Vitals  Enc Vitals Group     BP 11/08/18 1930 132/77     Pulse Rate 11/08/18 1930 (!) 101     Resp 11/08/18 1930 (!) 31     Temp 11/08/18 1835 98.5 F (36.9 C)     Temp Source 11/08/18 1835 Oral     SpO2 11/08/18 1930 98 %     Weight 11/08/18 1831 174 lb (78.9 kg)     Height 11/08/18 1831 5' (1.524 m)     Head Circumference --      Peak Flow --      Pain Score 11/08/18 1830 10     Pain Loc --      Pain Edu? --      Excl. in GC? --     Constitutional: Alert and oriented.  Uncomfortable appearing but in no acute distress. Eyes: Conjunctivae are normal.  Head: Atraumatic. Nose: No congestion/rhinnorhea. Mouth/Throat: Mucous membranes are moist.   Neck: Normal range of motion.  Cardiovascular: Normal rate, regular rhythm. ood peripheral circulation. Respiratory: Normal respiratory effort.   Gastrointestinal: No distention.  Musculoskeletal: Extremities warm and well perfused.  Pain on range of motion of right hip.  No deformity. Neurologic:  Normal speech and language. No gross focal neurologic deficits are appreciated.  Skin:  Skin is warm and dry. No rash noted.  Psychiatric: Mood and affect are normal. Speech and behavior are normal.  ____________________________________________   LABS (all labs ordered are listed, but only abnormal results are displayed)  Labs Reviewed  CBC WITH DIFFERENTIAL/PLATELET - Abnormal; Notable for the following components:      Result Value   WBC 12.0 (*)    Neutro Abs 8.3 (*)    All other components within normal limits  COMPREHENSIVE METABOLIC PANEL - Abnormal; Notable for the following components:   Sodium 132 (*)    Potassium 3.3 (*)    Chloride 97 (*)    CO2 21 (*)    Glucose, Bld 478 (*)    Calcium 8.8 (*)    AST 13 (*)    All other components within normal limits   ____________________________________________  EKG  ED ECG REPORT I, Dionne Bucy, the attending physician, personally viewed and interpreted this  ECG.  Date: 11/08/2018 EKG Time: 1835 Rate: 101 Rhythm: normal sinus rhythm QRS Axis: normal Intervals: normal ST/T Wave abnormalities: normal Narrative Interpretation: no evidence of acute ischemia ____________________________________________  RADIOLOGY  XR R hip: Nondisplaced acetabular fracture CT R hip: Nondisplaced acetabular fracture  ____________________________________________   PROCEDURES  Procedure(s) performed: No  Procedures  Critical Care performed: No ____________________________________________   INITIAL IMPRESSION / ASSESSMENT AND PLAN / ED COURSE  Pertinent labs & imaging results that were available during my care of the patient were reviewed by me and considered in my medical decision making (see chart for details).  33 year old female with PMH as noted above presents with right hip pain after a fall.  She did not directly strike the right hip, but felt a pop after her knee locked.  She has been unable to bear weight.  She denies any other injuries, and has no acute medical complaints.  On exam, the patient is uncomfortable but not ill-appearing.   She has slight tachycardia but otherwise normal vital signs.  The remainder of the exam is as described above.  X-ray shows findings consistent with acetabular fracture.  I consulted Dr. Joice Lofts from orthopedics who requested a CT and additional plain film images to further evaluate and determine a plan of care.  ----------------------------------------- 11:42 PM on 11/08/2018 -----------------------------------------  I reviewed the CT and additional x-ray results over the phone with Dr. Reginia Naas.  He advises that the patient likely will not need surgery but will need orthopedic follow-up.  He recommends crutches and nonweightbearing.    On reassessment, I discussed the orthopedic recommendations and plan of care with the patient.  I offered her admission for pain control, but the patient states that she would prefer to go home and follow-up as an outpatient.  Lab work-up reveals elevated glucose although the patient does not have an elevated anion gap.  She has also developed some tachycardia.  We will give normal saline to improve the hyperglycemia and hydrate the patient.  Anticipate discharge home if her glucose improves.  I signed the patient out to the oncoming physician Dr. Derrill Kay.   ____________________________________________   FINAL CLINICAL IMPRESSION(S) / ED DIAGNOSES  Final diagnoses:  Acetabular fracture (HCC)  Hyperglycemia      NEW MEDICATIONS STARTED DURING THIS VISIT:  New Prescriptions   No medications on file     Note:  This document was prepared using Dragon voice recognition software and may include unintentional dictation errors.   Dionne Bucy, MD 11/08/18 [phone]

## 2018-11-08 NOTE — ED Triage Notes (Signed)
Fall with rt hip pain. Mechanical.

## 2018-11-08 NOTE — Discharge Instructions (Signed)
You cannot bear weight on the right leg and should use crutches at all times when walking.  Call Dr. Binnie Rail office tomorrow to schedule follow-up appointment.  Return to the ER immediately for new or worsening pain, weakness, numbness, swelling, or any other new or worsening symptoms that concern you.

## 2018-11-09 LAB — GLUCOSE, CAPILLARY: Glucose-Capillary: 302 mg/dL — ABNORMAL HIGH (ref 70–99)

## 2018-11-09 MED ORDER — MORPHINE SULFATE (PF) 4 MG/ML IV SOLN
4.0000 mg | Freq: Once | INTRAVENOUS | Status: AC
  Administered 2018-11-09: 4 mg via INTRAVENOUS
  Filled 2018-11-09: qty 1

## 2018-11-09 NOTE — ED Notes (Signed)
Peripheral IV discontinued. Catheter intact. No signs of infiltration or redness. Gauze applied to IV site.   Discharge instructions reviewed with patient. Questions fielded by this RN. Patient verbalizes understanding of instructions. Patient discharged home in stable condition per Goodman. No acute distress noted at time of discharge.   

## 2018-11-13 DIAGNOSIS — E669 Obesity, unspecified: Secondary | ICD-10-CM | POA: Insufficient documentation

## 2018-12-11 ENCOUNTER — Encounter: Payer: Self-pay | Admitting: Emergency Medicine

## 2018-12-11 ENCOUNTER — Emergency Department
Admission: EM | Admit: 2018-12-11 | Discharge: 2018-12-11 | Disposition: A | Discharge status: Home / Self Care | Payer: Medicaid Other | Attending: Emergency Medicine | Admitting: Emergency Medicine

## 2018-12-11 ENCOUNTER — Other Ambulatory Visit: Payer: Self-pay

## 2018-12-11 DIAGNOSIS — F17210 Nicotine dependence, cigarettes, uncomplicated: Secondary | ICD-10-CM | POA: Diagnosis not present

## 2018-12-11 DIAGNOSIS — K029 Dental caries, unspecified: Secondary | ICD-10-CM | POA: Insufficient documentation

## 2018-12-11 DIAGNOSIS — E119 Type 2 diabetes mellitus without complications: Secondary | ICD-10-CM | POA: Diagnosis not present

## 2018-12-11 DIAGNOSIS — K0889 Other specified disorders of teeth and supporting structures: Secondary | ICD-10-CM | POA: Diagnosis present

## 2018-12-11 MED ORDER — AMOXICILLIN 500 MG PO CAPS: 500.0000 mg | ORAL_CAPSULE | Freq: Three times a day (TID) | ORAL | 0 refills | Status: DC

## 2018-12-11 MED ORDER — TRAMADOL HCL 50 MG PO TABS: 50.0000 mg | ORAL_TABLET | Freq: Four times a day (QID) | ORAL | 0 refills | Status: DC | PRN

## 2018-12-11 NOTE — ED Provider Notes (Signed)
[facility] Emergency Department Provider Note  ____________________________________________   None    (approximate)  I have reviewed the triage vital signs and the nursing notes.   HISTORY  Chief Complaint Dental Pain    HPI Victoria Cortez is a 33 y.o. female presents emergency department complaining of dental pain since last night.  She has a cracked tooth on the left lower side.  She has appointment with her dentist on Thursday of next week.  She states that she is sensitive to hot liquids.  She states cold liquids actually help with the pain.  She took ibuprofen without any relief.  She denies fever chills, chest pain or shortness of breath    Past Medical History:  Diagnosis Date  . Diabetes mellitus without complication ([place])   . Sickle cell trait [facility])     Patient Active Problem List   Diagnosis Date Noted  . Bacteremia 11/29/2015  . H/O cesarean section 02/03/2015  . S/P cesarean section 02/03/2015  . Uncontrolled diabetes mellitus ([place]) 01/12/2015  . Labor and delivery, indication for care 01/09/2015  . Pregnancy 11/26/2014    Past Surgical History:  Procedure Laterality Date  . CESAREAN SECTION    . CESAREAN SECTION WITH BILATERAL TUBAL LIGATION N/A 02/03/2015   Procedure: CESAREAN SECTION WITH BILATERAL TUBAL LIGATION;  Surgeon: Benjaman Kindler, MD;  Location: [facility] ORS;  Service: Obstetrics;  Laterality: N/A;    Prior to Admission medications   Medication Sig Start Date End Date Taking? Authorizing Provider  amoxicillin (AMOXIL) 500 MG capsule Take 1 capsule (500 mg total) by mouth 3 (three) times daily. 12/11/18   Nabiha Planck, Linden Dolin, PA-C  traMADol (ULTRAM) 50 MG tablet Take 1 tablet (50 mg total) by mouth every 6 (six) hours as needed. 12/11/18   Caryn Section Linden Dolin, PA-C    Allergies Rocephin [ceftriaxone]  Family History  Problem Relation Age of Onset  . Sickle cell anemia Other     Social History Social History   Tobacco  Use  . Smoking status: Current Every Day Smoker    Packs/day: 1.00    Types: Cigarettes  . Smokeless tobacco: Never Used  Substance Use Topics  . Alcohol use: Yes    Alcohol/week: 0.0 standard drinks  . Drug use: No    Review of Systems  Constitutional: No fever/chills Eyes: No visual changes. ENT: No sore throat.  Positive for dental pain Respiratory: Denies cough Genitourinary: Negative for dysuria. Musculoskeletal: Negative for back pain. Skin: Negative for rash.    ____________________________________________   PHYSICAL EXAM:  VITAL SIGNS: ED Triage Vitals  Enc Vitals Group     BP 12/11/18 1125 135/78     Pulse Rate 12/11/18 1125 80     Resp --      Temp 12/11/18 1125 97.7 F (36.5 C)     Temp Source 12/11/18 1125 Oral     SpO2 12/11/18 1125 100 %     Weight 12/11/18 1126 155 lb (70.3 kg)     Height 12/11/18 1126 5' (1.524 m)     Head Circumference --      Peak Flow --      Pain Score 12/11/18 1202 10     Pain Loc --      Pain Edu? --      Excl. in [place]? --     Constitutional: Alert and oriented. Well appearing and in no acute distress. Eyes: Conjunctivae are normal.  Head: Atraumatic. Nose: No congestion/rhinnorhea. Mouth/Throat: Mucous membranes  are moist.  Positive for left lower dental caries with redness and swelling noted at the gum Neck:  supple no lymphadenopathy noted Cardiovascular: Normal rate, regular rhythm. Respiratory: Normal respiratory effort.  No retractions, lungs c t a  GU: deferred Musculoskeletal: FROM all extremities, warm and well perfused Neurologic:  Normal speech and language.  Skin:  Skin is warm, dry and intact. No rash noted. Psychiatric: Mood and affect are normal. Speech and behavior are normal.  ____________________________________________   LABS (all labs ordered are listed, but only abnormal results are displayed)  Labs Reviewed - No data to display ____________________________________________    ____________________________________________  RADIOLOGY    ____________________________________________   PROCEDURES  Procedure(s) performed: No  Procedures    ____________________________________________   INITIAL IMPRESSION / ASSESSMENT AND PLAN / ED COURSE  Pertinent labs & imaging results that were available during my care of the patient were reviewed by me and considered in my medical decision making (see chart for details).   Patient is 33 year old female presents emergency department complaining of dental pain.  Physical exam shows a large dental caries in the left lower molar along with redness and swelling of the gum.  Explained findings to the patient.  She was placed on amoxicillin and given prescription for tramadol.  She is to follow-up with her regular dentist next week.  Return emergency department worsening.  She states she understands will comply.  She was discharged in stable condition.     As part of my medical decision making, I reviewed the following data within the electronic MEDICAL RECORD NUMBER Nursing notes reviewed and incorporated, Old chart reviewed, Notes from prior ED visits and [place] Controlled Substance Database  ____________________________________________   FINAL CLINICAL IMPRESSION(S) / ED DIAGNOSES  Final diagnoses:  Pain due to dental caries      NEW MEDICATIONS STARTED DURING THIS VISIT:  Discharge Medication List as of 12/11/2018 12:44 PM    START taking these medications   Details  amoxicillin (AMOXIL) 500 MG capsule Take 1 capsule (500 mg total) by mouth 3 (three) times daily., Starting Fri 12/11/2018, Normal    traMADol (ULTRAM) 50 MG tablet Take 1 tablet (50 mg total) by mouth every 6 (six) hours as needed., Starting Fri 12/11/2018, Normal         Note:  This document was prepared using Dragon voice recognition software and may include unintentional dictation errors.    Faythe GheeFisher, Alycea Segoviano W, PA-C 12/11/18 1336    Emily FilbertWilliams,  Jonathan E, MD 12/11/18 [phone]

## 2018-12-11 NOTE — ED Notes (Signed)
See triage note  States she developed dental pain last pm  States she has a cracked tooth no swelling noted

## 2018-12-11 NOTE — ED Triage Notes (Signed)
Pt to ED from home c/o left lower dental pain since yesterday, difficulty chewing, maintaining secretions, speaking in complete and coherent sentences.

## 2018-12-11 NOTE — Discharge Instructions (Addendum)
Follow up with  your dentist.  Take the medication as prescribed.  Return if worsening

## 2019-07-19 ENCOUNTER — Other Ambulatory Visit: Payer: Self-pay

## 2019-07-19 ENCOUNTER — Encounter: Payer: Self-pay | Admitting: Emergency Medicine

## 2019-07-19 ENCOUNTER — Emergency Department
Admission: EM | Admit: 2019-07-19 | Discharge: 2019-07-19 | Disposition: A | Discharge status: Home / Self Care | Payer: Medicaid Other | Attending: Emergency Medicine | Admitting: Emergency Medicine

## 2019-07-19 DIAGNOSIS — N12 Tubulo-interstitial nephritis, not specified as acute or chronic: Secondary | ICD-10-CM | POA: Diagnosis not present

## 2019-07-19 DIAGNOSIS — E1165 Type 2 diabetes mellitus with hyperglycemia: Secondary | ICD-10-CM | POA: Insufficient documentation

## 2019-07-19 DIAGNOSIS — F17210 Nicotine dependence, cigarettes, uncomplicated: Secondary | ICD-10-CM | POA: Insufficient documentation

## 2019-07-19 DIAGNOSIS — R1030 Lower abdominal pain, unspecified: Secondary | ICD-10-CM | POA: Diagnosis present

## 2019-07-19 DIAGNOSIS — R739 Hyperglycemia, unspecified: Secondary | ICD-10-CM

## 2019-07-19 LAB — CBC
HCT: 40.5 % (ref 36.0–46.0)
Hemoglobin: 14.2 g/dL (ref 12.0–15.0)
MCH: 30.3 pg (ref 26.0–34.0)
MCHC: 35.1 g/dL (ref 30.0–36.0)
MCV: 86.4 fL (ref 80.0–100.0)
Platelets: 202 10*3/uL (ref 150–400)
RBC: 4.69 MIL/uL (ref 3.87–5.11)
RDW: 12.1 % (ref 11.5–15.5)
WBC: 22.3 10*3/uL — ABNORMAL HIGH (ref 4.0–10.5)
nRBC: 0 % (ref 0.0–0.2)

## 2019-07-19 LAB — URINALYSIS, COMPLETE (UACMP) WITH MICROSCOPIC
Bilirubin Urine: NEGATIVE
Glucose, UA: >=500 mg/dL — AB
Hgb urine dipstick: NEGATIVE
Ketones, ur: 5 mg/dL — AB
Nitrite: NEGATIVE
Protein, ur: 100 mg/dL — AB
Specific Gravity, Urine: 1.017 (ref 1.005–1.030)
WBC, UA: >50 WBC/hpf — ABNORMAL HIGH (ref 0–5)
pH: 5 (ref 5.0–8.0)

## 2019-07-19 LAB — BASIC METABOLIC PANEL
Anion gap: 11 (ref 5–15)
BUN: 10 mg/dL (ref 6–20)
CO2: 23 mmol/L (ref 22–32)
Calcium: 9 mg/dL (ref 8.9–10.3)
Chloride: 98 mmol/L (ref 98–111)
Creatinine, Ser: 1.14 mg/dL — ABNORMAL HIGH (ref 0.44–1.00)
GFR calc Af Amer: >60 mL/min (ref >60)
GFR calc non Af Amer: >60 mL/min (ref >60)
Glucose, Bld: 346 mg/dL — ABNORMAL HIGH (ref 70–99)
Potassium: 4.5 mmol/L (ref 3.5–5.1)
Sodium: 132 mmol/L — ABNORMAL LOW (ref 135–145)

## 2019-07-19 LAB — LACTIC ACID, PLASMA: Lactic Acid, Venous: 1.2 mmol/L (ref 0.5–1.9)

## 2019-07-19 MED ORDER — LEVOFLOXACIN IN D5W 750 MG/150ML IV SOLN
750.0000 mg | Freq: Once | INTRAVENOUS | Status: AC
  Administered 2019-07-19: 750 mg via INTRAVENOUS
  Filled 2019-07-19: qty 150

## 2019-07-19 MED ORDER — SODIUM CHLORIDE 0.9 % IV BOLUS
1000.0000 mL | Freq: Once | INTRAVENOUS | Status: AC
  Administered 2019-07-19: 12:00:00 1000 mL via INTRAVENOUS

## 2019-07-19 MED ORDER — LEVOFLOXACIN 750 MG PO TABS: 750.0000 mg | ORAL_TABLET | Freq: Every day | ORAL | 0 refills | Status: AC

## 2019-07-19 NOTE — ED Notes (Signed)
See triage note  Presents with some body aches  States she noticed that her legs were achy on Saturday after work  States discomfort has become worse unsure of fever at home but is febrile on arrival   States she is having increased pain ti right flank area with inspiration.

## 2019-07-19 NOTE — ED Provider Notes (Signed)
[facility] Emergency Department Provider Note ____________________________________________   First MD Initiated Contact with Patient 07/19/19 1031     (approximate)  I have reviewed the triage vital signs and the nursing notes.   HISTORY  Chief Complaint Generalized Body Aches, Back Pain, and Urinary Frequency  HPI Victoria Cortez is a 34 y.o. female who presents to the emergency department for treatment and evaluation of body aches, back pain, lower abdominal pain, urinary frequency, and malodorous urine.  Symptoms began Friday and have progressively worsened.  Subjective fever at home.  She has felt nauseated but has no vomiting or diarrhea.  No known exposure to COVID-19.  Past Medical History:  Diagnosis Date  . Diabetes mellitus without complication ([facility])   . Sickle cell trait [facility])     Patient Active Problem List   Diagnosis Date Noted  . Bacteremia 11/29/2015  . H/O cesarean section 02/03/2015  . S/P cesarean section 02/03/2015  . Uncontrolled diabetes mellitus ([facility]) 01/12/2015  . Labor and delivery, indication for care 01/09/2015  . Pregnancy 11/26/2014    Past Surgical History:  Procedure Laterality Date  . CESAREAN SECTION    . CESAREAN SECTION WITH BILATERAL TUBAL LIGATION N/A 02/03/2015   Procedure: CESAREAN SECTION WITH BILATERAL TUBAL LIGATION;  Surgeon: Christeen Douglas, MD;  Location: [facility] ORS;  Service: Obstetrics;  Laterality: N/A;    Prior to Admission medications   Medication Sig Start Date End Date Taking? Authorizing Provider  levofloxacin (LEVAQUIN) 750 MG tablet Take 1 tablet (750 mg total) by mouth daily for 7 days. 07/19/19 07/26/19  Chinita Pester, FNP    Allergies Rocephin [ceftriaxone]  Family History  Problem Relation Age of Onset  . Sickle cell anemia Other     Social History Social History   Tobacco Use  . Smoking status: Current Every Day Smoker    Packs/day: 1.00    Types: Cigarettes  . Smokeless  tobacco: Never Used  Substance Use Topics  . Alcohol use: Yes    Alcohol/week: 0.0 standard drinks  . Drug use: No    Review of Systems  Constitutional: Positive for fever and chills Eyes: No visual changes. ENT: No sore throat. Cardiovascular: Denies chest pain. Respiratory: Denies shortness of breath. Gastrointestinal: Positive for abdominal pain.  Positive for nausea.  Negative for vomiting or diarrhea. Genitourinary: Positive for urinary frequency and malodorous urine. Musculoskeletal: Positive for back pain. Skin: Negative for rash. Neurological: Negative for headaches, focal weakness or numbness. ___________________________________________   PHYSICAL EXAM:  VITAL SIGNS: ED Triage Vitals  Enc Vitals Group     BP 07/19/19 0911 118/78     Pulse Rate 07/19/19 0911 (!) 132     Resp 07/19/19 0911 20     Temp 07/19/19 0910 100.1 F (37.8 C)     Temp Source 07/19/19 0910 Oral     SpO2 07/19/19 0911 99 %     Weight 07/19/19 0906 155 lb (70.3 kg)     Height 07/19/19 0906 5' (1.524 m)     Head Circumference --      Peak Flow --      Pain Score 07/19/19 0906 5     Pain Loc --      Pain Edu? --      Excl. in [place]? --     Constitutional: Alert and oriented. Well appearing and in no acute distress. Eyes: Conjunctivae are normal. PERRL. EOMI. Head: Atraumatic. Nose: No congestion/rhinnorhea. Mouth/Throat: Mucous membranes are moist. Neck: No stridor.  Hematological/Lymphatic/Immunilogical: No cervical lymphadenopathy. Cardiovascular: Normal rate, regular rhythm. Grossly normal heart sounds.  Good peripheral circulation. Respiratory: Normal respiratory effort.  No retractions. Lungs CTAB. Gastrointestinal: Soft and nontender. No distention. No abdominal bruits. No CVA tenderness. Musculoskeletal: No lower extremity tenderness nor edema.  No joint effusions. Neurologic:  Normal speech and language. No gross focal neurologic deficits are appreciated. No gait  instability. Skin:  Skin is warm, dry and intact. No rash noted. Psychiatric: Mood and affect are normal. Speech and behavior are normal.  ____________________________________________   LABS (all labs ordered are listed, but only abnormal results are displayed)  Labs Reviewed  URINALYSIS, COMPLETE (UACMP) WITH MICROSCOPIC - Abnormal; Notable for the following components:      Result Value   Color, Urine YELLOW (*)    APPearance CLOUDY (*)    Glucose, UA >=500 (*)    Ketones, ur 5 (*)    Protein, ur 100 (*)    Leukocytes,Ua MODERATE (*)    WBC, UA >50 (*)    Bacteria, UA RARE (*)    All other components within normal limits  CBC - Abnormal; Notable for the following components:   WBC 22.3 (*)    All other components within normal limits  BASIC METABOLIC PANEL - Abnormal; Notable for the following components:   Sodium 132 (*)    Glucose, Bld 346 (*)    Creatinine, Ser 1.14 (*)    All other components within normal limits  CULTURE, BLOOD (ROUTINE X 2)  CULTURE, BLOOD (ROUTINE X 2)  URINE CULTURE  LACTIC ACID, PLASMA  LACTIC ACID, PLASMA   ____________________________________________  EKG  ED ECG REPORT I, Dejah Droessler, FNP-BC personally viewed and interpreted this ECG.   Date: 07/19/2019  EKG Time: 9:17  Rate: 130  Rhythm: sinus tachycardia  Axis: normal  Intervals:none  ST&T Change: no ST elevation  ____________________________________________  RADIOLOGY  ED MD interpretation:    Not indicated.  Official radiology report(s): No results found.  ____________________________________________   PROCEDURES  Procedure(s) performed (including Critical Care):  Procedures  ____________________________________________   INITIAL IMPRESSION / ASSESSMENT AND PLAN     34 year old female presenting to the emergency department for treatment and evaluation of body aches, lower abdominal pain, and back pain with subjective fever.  See HPI for further details.   While awaiting ER room assignment, white blood cell count shows leukocytosis at 22.3, hyponatremia 132, slightly elevated creatinine at 1.14, glucose of 346, and cloudy urine with ketones, protein, moderate leukocytes, greater than 50 white blood cells with bacteria and glucose of greater than 500.  Plan will be to administer IV fluids, draw lactic acid, blood cultures, add urine culture, and administer Levaquin.  Patient is allergic to Rocephin.  DIFFERENTIAL DIAGNOSIS  Acute cystitis, pyelonephritis, urosepsis  ED COURSE  IV fluids and Levaquin administered.  Lactic acid is normal at 1.2.  Patient will be discharged home with prescription for Levaquin.  She is to see her primary care provider in 7 to 10 days or sooner for symptoms of concern.  If she gets worse, she is to return to the ER if she cannot get an appointment with primary care. ____________________________________________   FINAL CLINICAL IMPRESSION(S) / ED DIAGNOSES  Final diagnoses:  Pyelonephritis, acute  Hyperglycemia     ED Discharge Orders         Ordered    levofloxacin (LEVAQUIN) 750 MG tablet  Daily     07/19/19 1257  Note:  This document was prepared using Dragon voice recognition software and may include unintentional dictation errors.   Victorino Dike, FNP 07/19/19 1301    Earleen Newport, MD 07/19/19 1328

## 2019-07-19 NOTE — Discharge Instructions (Signed)
Please follow up with primary care in 7-10 days or sooner if your symptoms worsen.  Return to the ER for concerns if you are unable to see your primary care provider.

## 2019-07-19 NOTE — ED Triage Notes (Signed)
Pt reports Saturday started with bodyaches and back pain. Pt concerned she has a UTI because she urinates frequently and it has an odor to it.

## 2019-07-21 LAB — URINE CULTURE: Culture: >=100000 — AB

## 2019-07-22 ENCOUNTER — Telehealth: Payer: Self-pay | Admitting: Emergency Medicine

## 2019-07-22 LAB — BLOOD CULTURE ID PANEL (REFLEXED)

## 2019-07-22 NOTE — Telephone Encounter (Signed)
Call from Lab , Blood Culture results positive, spoke with Dr.Robinson in reference to blood culture results , he reviewed results and contacted patient himself

## 2019-07-22 NOTE — ED Provider Notes (Signed)
Was notified by lab staff of a positive blood culture and urine culture.  Culture does show gram-negative rods patient was treated for acute cystitis.  No evidence of sepsis at that time.  She was discharged on Levaquin.  I called to notify patient of the blood work and to check on her.  She states that she feels significantly improved.  She is not having fevers.  Pain resolved.  No vomiting.  Positive culture was only in 1 specimen.  Instructed the patient to return to the ER for possible admission and IV antibiotics should she develop return of symptoms which would include fever, vomiting, pain, weakness or for any additional new symptoms.  Otherwise as the patient feels significantly improved I suspect that antibiotics are appropriately covering her.   Willy Eddy, MD 07/22/19 1021

## 2019-07-24 LAB — CULTURE, BLOOD (ROUTINE X 2)
Culture: NO GROWTH
Special Requests: ADEQUATE

## 2019-07-25 NOTE — Progress Notes (Signed)
ED Antimicrobial Stewardship Positive Culture Follow Up   Victoria Cortez is an 34 y.o. female who presented to [facility] on 07/19/2019 with a chief complaint of  Chief Complaint  Patient presents with  . Generalized Body Aches  . Back Pain  . Urinary Frequency    Recent Results (from the past 720 hour(s))  Blood culture (routine x 2)     Status: None   Collection Time: 07/19/19 10:52 AM   Specimen: BLOOD  Result Value Ref Range Status   Specimen Description BLOOD RIGHT [facility]  Final   Special Requests   Final    BOTTLES DRAWN AEROBIC AND ANAEROBIC Blood Culture adequate volume   Culture   Final    NO GROWTH 5 DAYS Performed at [facility], [address] [place], [place] [address]    Report Status 07/24/2019 FINAL  Final  Blood culture (routine x 2)     Status: Abnormal   Collection Time: 07/19/19 10:52 AM   Specimen: BLOOD  Result Value Ref Range Status   Specimen Description   Final    BLOOD RIGHT ANTECUBITAL Performed at [facility], [address] [place], [place] [address]    Special Requests   Final    BOTTLES DRAWN AEROBIC AND ANAEROBIC Blood Culture results may not be optimal due to an excessive volume of blood received in culture bottles Performed at [facility], [address] [place], [place] [address]    Culture  Setup Time   Final    GRAM NEGATIVE RODS AEROBIC BOTTLE ONLY CRITICAL RESULT CALLED TO, READ BACK BY AND VERIFIED WITHRomie Minus AT 2130 07/22/19 SDR Performed at [facility], [place] [address], [place], [place] [address]    Culture ESCHERICHIA COLI (A)  Final   Report Status 07/24/2019 FINAL  Final   Organism ID, Bacteria ESCHERICHIA COLI  Final      Susceptibility   Escherichia coli - MIC*    AMPICILLIN <=2 SENSITIVE Sensitive     CEFAZOLIN <=4 SENSITIVE Sensitive     CEFEPIME <=0.12 SENSITIVE Sensitive     CEFTAZIDIME <=1 SENSITIVE Sensitive     CEFTRIAXONE <=0.25 SENSITIVE Sensitive     CIPROFLOXACIN  <=0.25 SENSITIVE Sensitive     GENTAMICIN <=1 SENSITIVE Sensitive     IMIPENEM <=0.25 SENSITIVE Sensitive     TRIMETH/SULFA <=20 SENSITIVE Sensitive     AMPICILLIN/SULBACTAM <=2 SENSITIVE Sensitive     PIP/TAZO <=4 SENSITIVE Sensitive     * ESCHERICHIA COLI  Urine culture     Status: Abnormal   Collection Time: 07/19/19 10:52 AM   Specimen: Urine, Random  Result Value Ref Range Status   Specimen Description URINE, RANDOM  Final   Special Requests NONE  Final   Culture >=100,000 COLONIES/mL ESCHERICHIA COLI (A)  Final   Report Status 07/21/2019 FINAL  Final   Organism ID, Bacteria ESCHERICHIA COLI (A)  Final      Susceptibility   Escherichia coli - MIC*    AMPICILLIN 4 SENSITIVE Sensitive     CEFAZOLIN <=4 SENSITIVE Sensitive     CEFTRIAXONE <=0.25 SENSITIVE Sensitive     CIPROFLOXACIN <=0.25 SENSITIVE Sensitive     GENTAMICIN <=1 SENSITIVE Sensitive     IMIPENEM <=0.25 SENSITIVE Sensitive     NITROFURANTOIN <=16 SENSITIVE Sensitive     TRIMETH/SULFA <=20 SENSITIVE Sensitive     AMPICILLIN/SULBACTAM <=2 SENSITIVE Sensitive     PIP/TAZO Value in next row Sensitive      <=4  SENSITIVEPerformed at [facility], [address] [place], [place] [address]    * >=100,000 COLONIES/mL ESCHERICHIA COLI  Blood Culture ID Panel (Reflexed)     Status: Abnormal   Collection Time: 07/19/19 10:52 AM  Result Value Ref Range Status   Enterococcus species NOT DETECTED NOT DETECTED Final   Listeria monocytogenes NOT DETECTED NOT DETECTED Final   Staphylococcus species NOT DETECTED NOT DETECTED Final   Staphylococcus aureus (BCID) NOT DETECTED NOT DETECTED Final   Streptococcus species NOT DETECTED NOT DETECTED Final   Streptococcus agalactiae NOT DETECTED NOT DETECTED Final   Streptococcus pneumoniae NOT DETECTED NOT DETECTED Final   Streptococcus pyogenes NOT DETECTED NOT DETECTED Final   Acinetobacter baumannii NOT DETECTED NOT DETECTED Final   Enterobacteriaceae species DETECTED (A) NOT  DETECTED Final    Comment: Enterobacteriaceae represent a large family of gram-negative bacteria, not a single organism. CRITICAL RESULT CALLED TO, READ BACK BY AND VERIFIED WITH:  GREG MOYER AT 5188 07/22/19 SDR    Enterobacter cloacae complex NOT DETECTED NOT DETECTED Final   Escherichia coli DETECTED (A) NOT DETECTED Final    Comment: CRITICAL RESULT CALLED TO, READ BACK BY AND VERIFIED WITH:  GREG MOYER AT 4166 07/22/19 SDR    Klebsiella oxytoca NOT DETECTED NOT DETECTED Final   Klebsiella pneumoniae NOT DETECTED NOT DETECTED Final   Proteus species NOT DETECTED NOT DETECTED Final   Serratia marcescens NOT DETECTED NOT DETECTED Final   Carbapenem resistance NOT DETECTED NOT DETECTED Final   Haemophilus influenzae NOT DETECTED NOT DETECTED Final   Neisseria meningitidis NOT DETECTED NOT DETECTED Final   Pseudomonas aeruginosa NOT DETECTED NOT DETECTED Final   Candida albicans NOT DETECTED NOT DETECTED Final   Candida glabrata NOT DETECTED NOT DETECTED Final   Candida krusei NOT DETECTED NOT DETECTED Final   Candida parapsilosis NOT DETECTED NOT DETECTED Final   Candida tropicalis NOT DETECTED NOT DETECTED Final    Comment: Performed at [facility], [address], [place], [place] [address]    Pharmacy has reviewed positive blood cultures. Blood cultures positive for pan sensitive E. Coli. Patient received levofloxacin 750mg  IV x 1 in ED and was discharged from ED with Levofloxacin 750mg  PO x 7 days.  In most cases, the duration of antibiotic therapy is 7 to 14 days for GNR bacteremia in adults.  Provider has followed up with patient and she stated she fells significantly improved. She has been instructed to return to ED should she develop any symptoms including fever, vomiting, pain or weakness.   ED Provider: , MD  , PharmD, BCPS Clinical Pharmacist 07/25/2019 8:38 AM

## 2020-01-10 ENCOUNTER — Emergency Department
Admission: EM | Admit: 2020-01-10 | Discharge: 2020-01-10 | Disposition: A | Discharge status: Home / Self Care | Payer: Medicaid Other | Attending: Emergency Medicine | Admitting: Emergency Medicine

## 2020-01-10 ENCOUNTER — Other Ambulatory Visit: Payer: Self-pay

## 2020-01-10 DIAGNOSIS — L0231 Cutaneous abscess of buttock: Secondary | ICD-10-CM

## 2020-01-10 DIAGNOSIS — F17210 Nicotine dependence, cigarettes, uncomplicated: Secondary | ICD-10-CM | POA: Diagnosis not present

## 2020-01-10 DIAGNOSIS — E119 Type 2 diabetes mellitus without complications: Secondary | ICD-10-CM | POA: Diagnosis not present

## 2020-01-10 MED ORDER — SULFAMETHOXAZOLE-TRIMETHOPRIM 800-160 MG PO TABS: 1.0000 | ORAL_TABLET | Freq: Two times a day (BID) | ORAL | 0 refills | Status: DC

## 2020-01-10 MED ORDER — NAPROXEN 500 MG PO TABS: 500.0000 mg | ORAL_TABLET | Freq: Two times a day (BID) | ORAL | 0 refills | Status: DC

## 2020-01-10 NOTE — ED Notes (Signed)
4X4 gauze placed over pt's abscess to control drainage. Pt educated on cleaning instructions. Pt verbalized understanding and discharge instructions.

## 2020-01-10 NOTE — ED Triage Notes (Signed)
Pt states she has a "boil" beside her anus and is it popped on Saturday and has been draining.

## 2020-01-10 NOTE — ED Provider Notes (Signed)
[facility] Emergency Department Provider Note   ____________________________________________   First MD Initiated Contact with Patient 01/10/20 [phone]     (approximate)  I have reviewed the triage vital signs and the nursing notes.   HISTORY  Chief Complaint Abscess    HPI Victoria Cortez is a 34 y.o. female patient presents for draining abscess right buttocks for 2 days.  Patient denies fever associated with complaint.  Patient rates pain as a 5/10.  Patient described pain as "sore".  No palliative measures for complaint.         Past Medical History:  Diagnosis Date  . Diabetes mellitus without complication (HCC)   . Sickle cell trait [facility])     Patient Active Problem List   Diagnosis Date Noted  . Bacteremia 11/29/2015  . H/O cesarean section 02/03/2015  . S/P cesarean section 02/03/2015  . Uncontrolled diabetes mellitus (HCC) 01/12/2015  . Labor and delivery, indication for care 01/09/2015  . Pregnancy 11/26/2014    Past Surgical History:  Procedure Laterality Date  . CESAREAN SECTION    . CESAREAN SECTION WITH BILATERAL TUBAL LIGATION N/A 02/03/2015   Procedure: CESAREAN SECTION WITH BILATERAL TUBAL LIGATION;  Surgeon: Christeen Douglas, MD;  Location: [facility] ORS;  Service: Obstetrics;  Laterality: N/A;    Prior to Admission medications   Medication Sig Start Date End Date Taking? Authorizing Provider  naproxen (NAPROSYN) 500 MG tablet Take 1 tablet (500 mg total) by mouth 2 (two) times daily with a meal. 01/10/20   Joni Reining, PA-C  sulfamethoxazole-trimethoprim (BACTRIM DS) 800-160 MG tablet Take 1 tablet by mouth 2 (two) times daily. 01/10/20   Joni Reining, PA-C    Allergies Rocephin [ceftriaxone]  Family History  Problem Relation Age of Onset  . Sickle cell anemia Other     Social History Social History   Tobacco Use  . Smoking status: Current Every Day Smoker    Packs/day: 1.00    Types: Cigarettes  . Smokeless  tobacco: Never Used  Vaping Use  . Vaping Use: Never used  Substance Use Topics  . Alcohol use: Yes    Alcohol/week: 0.0 standard drinks  . Drug use: No    Review of Systems Constitutional: No fever/chills Eyes: No visual changes. ENT: No sore throat. Cardiovascular: Denies chest pain. Respiratory: Denies shortness of breath. Gastrointestinal: No abdominal pain.  No nausea, no vomiting.  No diarrhea.  No constipation. Genitourinary: Negative for dysuria. Musculoskeletal: Negative for back pain. Skin: Abscess right buttocks. Neurological: Negative for headaches, focal weakness or numbness. Allergic/Immunilogical: Rocephin  ____________________________________________   PHYSICAL EXAM:  VITAL SIGNS: ED Triage Vitals  Enc Vitals Group     BP 01/10/20 0716 140/82     Pulse Rate 01/10/20 0716 (!) 103     Resp 01/10/20 0716 16     Temp 01/10/20 0716 98.2 F (36.8 C)     Temp Source 01/10/20 0716 Oral     SpO2 01/10/20 0716 98 %     Weight 01/10/20 0718 169 lb (76.7 kg)     Height 01/10/20 0718 5' (1.524 m)     Head Circumference --      Peak Flow --      Pain Score 01/10/20 0718 5     Pain Loc --      Pain Edu? --      Excl. in GC? --    Constitutional: Alert and oriented. Well appearing and in no acute distress. Cardiovascular: Normal rate,  regular rhythm. Grossly normal heart sounds.  Good peripheral circulation. Respiratory: Normal respiratory effort.  No retractions. Lungs CTAB. Skin: Chaperoned exam reveal draining abscess right buttocks. Psychiatric: Mood and affect are normal. Speech and behavior are normal.  ____________________________________________   LABS (all labs ordered are listed, but only abnormal results are displayed)  Labs Reviewed - No data to display ____________________________________________  EKG   ____________________________________________  RADIOLOGY  ED MD interpretation:    Official radiology report(s): No results  found.  ____________________________________________   PROCEDURES  Procedure(s) performed (including Critical Care):  Procedures   ____________________________________________   INITIAL IMPRESSION / ASSESSMENT AND PLAN / ED COURSE  As part of my medical decision making, I reviewed the following data within the electronic MEDICAL RECORD NUMBER     Patient presents with abscess right buttocks for 2 days.  Discussed rationale for follow-up further interventions at this time.  Patient given discharge care instruction for conservative treatment.  Patient advised follow-up surgical clinic if no improvement in 1 week.  Return to ED if condition worsen.  Take medication as directed.    Victoria Cortez was evaluated in Emergency Department on 01/10/2020 for the symptoms described in the history of present illness. She was evaluated in the context of the global COVID-19 pandemic, which necessitated consideration that the patient might be at risk for infection with the SARS-CoV-2 virus that causes COVID-19. Institutional protocols and algorithms that pertain to the evaluation of patients at risk for COVID-19 are in a state of rapid change based on information released by regulatory bodies including the CDC and federal and state organizations. These policies and algorithms were followed during the patient's care in the ED.       ____________________________________________   FINAL CLINICAL IMPRESSION(S) / ED DIAGNOSES  Final diagnoses:  Abscess of buttock, right     ED Discharge Orders         Ordered    sulfamethoxazole-trimethoprim (BACTRIM DS) 800-160 MG tablet  2 times daily     Discontinue  Reprint     01/10/20 0803    naproxen (NAPROSYN) 500 MG tablet  2 times daily with meals     Discontinue  Reprint     01/10/20 0803           Note:  This document was prepared using Dragon voice recognition software and may include unintentional dictation errors.    Joni Reining,  PA-C 01/10/20 5027    Shaune Pollack, MD 01/11/20 [phone]

## 2020-01-10 NOTE — Discharge Instructions (Signed)
Follow discharge care instruction take medication as directed.  Advised sitz bath with Epson salt once a day.  Follow-up with surgical clinic if recurrence or no improvement in 1 week.

## 2020-01-11 ENCOUNTER — Telehealth: Payer: Self-pay

## 2020-01-11 NOTE — Telephone Encounter (Signed)
Left message to call office and schedule appointment 1-2 weeks for right buttock abscess per Dr.Pabon- please schedule the appointment when speaking with the patient.

## 2020-01-19 ENCOUNTER — Encounter: Payer: Self-pay | Admitting: Surgery

## 2020-01-19 ENCOUNTER — Other Ambulatory Visit: Payer: Self-pay

## 2020-01-19 ENCOUNTER — Ambulatory Visit (INDEPENDENT_AMBULATORY_CARE_PROVIDER_SITE_OTHER): Payer: Medicaid Other | Admitting: Surgery

## 2020-01-19 VITALS — BP 125/85 | HR 102 | Temp 98.9°F | Resp 12 | Ht 60.0 in | Wt 169.0 lb

## 2020-01-19 DIAGNOSIS — K610 Anal abscess: Secondary | ICD-10-CM | POA: Diagnosis not present

## 2020-01-19 DIAGNOSIS — D573 Sickle-cell trait: Secondary | ICD-10-CM | POA: Insufficient documentation

## 2020-01-19 DIAGNOSIS — K219 Gastro-esophageal reflux disease without esophagitis: Secondary | ICD-10-CM | POA: Diagnosis not present

## 2020-01-19 DIAGNOSIS — N939 Abnormal uterine and vaginal bleeding, unspecified: Secondary | ICD-10-CM | POA: Insufficient documentation

## 2020-01-19 MED ORDER — OMEPRAZOLE 40 MG PO CPDR: 40.0000 mg | DELAYED_RELEASE_CAPSULE | Freq: Every day | ORAL | 0 refills | Status: DC

## 2020-01-19 NOTE — Patient Instructions (Signed)
Please pick up your medication at the pharmacy. Please call our office if you have questions or concerns.

## 2020-01-20 NOTE — Progress Notes (Signed)
Patient ID: Victoria Cortez, female   DOB: [birth year], 34 y.o.   MRN: [id number]  HPI Victoria Cortez is a 34 y.o. female seen to follow up buttocks abscess. She came to the ER 10 days ago for perirectal pain and drainage. Pain was mod to severe and sharp. Worsening when sitting. She does have Dm. CBC nml  Except wbc and BMP nml except glucose. She is able to perform more than 6 METS w/o SOB or C/P. She was prescribed Bactrim w significant improvement of sxs. No fevers or chills. Pain has improved. She does have significant reflux sxs and cough,. Burning sensation. SHe does not have a script for ppi  HPI  Past Medical History:  Diagnosis Date  . Diabetes mellitus without complication (HCC)   . Sickle cell trait [facility])     Past Surgical History:  Procedure Laterality Date  . CESAREAN SECTION    . CESAREAN SECTION WITH BILATERAL TUBAL LIGATION N/A 02/03/2015   Procedure: CESAREAN SECTION WITH BILATERAL TUBAL LIGATION;  Surgeon: Christeen Douglas, MD;  Location: [facility] ORS;  Service: Obstetrics;  Laterality: N/A;    Family History  Problem Relation Age of Onset  . Sickle cell anemia Other   . Diabetes Father   . Sickle cell anemia Father     Social History Social History   Tobacco Use  . Smoking status: Current Every Day Smoker    Packs/day: 0.50    Years: 11.00    Pack years: 5.50    Types: Cigarettes  . Smokeless tobacco: Never Used  Vaping Use  . Vaping Use: Never used  Substance Use Topics  . Alcohol use: Yes    Alcohol/week: 0.0 standard drinks  . Drug use: No    Allergies  Allergen Reactions  . Rocephin [Ceftriaxone]     Current Outpatient Medications  Medication Sig Dispense Refill  . ibuprofen (ADVIL) 800 MG tablet Take 1 tablet by mouth every 8 (eight) hours as needed.    . naproxen (NAPROSYN) 500 MG tablet Take 1 tablet (500 mg total) by mouth 2 (two) times daily with a meal. 20 tablet 0  . sulfamethoxazole-trimethoprim (BACTRIM DS) 800-160 MG tablet Take 1  tablet by mouth 2 (two) times daily. 20 tablet 0  . omeprazole (PRILOSEC) 40 MG capsule Take 1 capsule (40 mg total) by mouth daily. 90 capsule 0   No current facility-administered medications for this visit.     Review of Systems Full ROS  was asked and was negative except for the information on the HPI  Physical Exam Blood pressure 125/85, pulse (!) 102, temperature 98.9 F (37.2 C), temperature source Oral, resp. rate 12, height 5' (1.524 m), weight 169 lb (76.7 kg), last menstrual period 12/31/2019, SpO2 99 %, unknown if currently breastfeeding. CONSTITUTIONAL: NAD EYES: Pupils are equal, round, and reactive to light, Sclera are non-icteric. EARS, NOSE, MOUTH AND THROAT: She is wearing a mask, Hearing is intact to voice. LYMPH NODES:  Lymph nodes in the neck are normal. RESPIRATORY:  Lungs are clear. There is normal respiratory effort, with equal breath sounds bilaterally, and without pathologic use of accessory muscles. CARDIOVASCULAR: Heart is regular without murmurs, gallops, or rubs. GI: The abdomen is  soft, nontender, and nondistended. There are no palpable masses. There is no hepatosplenomegaly. There are normal bowel sounds in all quadrants. RECTAL: post midline with two superficial ulcerations measuring less 12mm, no fluctuance, no pain.  MUSCULOSKELETAL: Normal muscle strength and tone. No cyanosis or edema.   SKIN: Turgor  is good and there are no pathologic skin lesions or ulcers. NEUROLOGIC: Motor and sensation is grossly normal. Cranial nerves are grossly intact. PSYCH:  Oriented to person, place and time. Affect is normal.  Data Reviewed I have personally reviewed the patient's imaging, laboratory findings and medical records.    Assessment/Plan 34 yo female w resolved perianal abscess vs pilonidal abscess. No evidence of infection and no need for I/d.  Additionally she has classic GERD sxs that require rx. I will start PPI BID. F/U in a few weeks , may need further  w/u  Time spent with the patient was 45 minutes, with more than 50% of the time spent in face-to-face education, counseling and care coordination.     Sterling Big, MD FACS General Surgeon 01/20/2020, 10:43 AM

## 2020-06-08 ENCOUNTER — Encounter: Payer: Self-pay | Admitting: Emergency Medicine

## 2020-06-08 ENCOUNTER — Inpatient Hospital Stay
Admission: EM | Admit: 2020-06-08 | Discharge: 2020-06-10 | DRG: 872 | Disposition: A | Discharge status: Home / Self Care | Payer: Medicaid Other | Attending: Internal Medicine | Admitting: Internal Medicine

## 2020-06-08 ENCOUNTER — Other Ambulatory Visit: Payer: Self-pay

## 2020-06-08 ENCOUNTER — Emergency Department: Payer: Medicaid Other

## 2020-06-08 DIAGNOSIS — N10 Acute tubulo-interstitial nephritis: Secondary | ICD-10-CM | POA: Diagnosis not present

## 2020-06-08 DIAGNOSIS — A419 Sepsis, unspecified organism: Secondary | ICD-10-CM | POA: Diagnosis not present

## 2020-06-08 DIAGNOSIS — Z832 Family history of diseases of the blood and blood-forming organs and certain disorders involving the immune mechanism: Secondary | ICD-10-CM

## 2020-06-08 DIAGNOSIS — D573 Sickle-cell trait: Secondary | ICD-10-CM | POA: Diagnosis not present

## 2020-06-08 DIAGNOSIS — N12 Tubulo-interstitial nephritis, not specified as acute or chronic: Secondary | ICD-10-CM

## 2020-06-08 DIAGNOSIS — E871 Hypo-osmolality and hyponatremia: Secondary | ICD-10-CM

## 2020-06-08 DIAGNOSIS — Z20822 Contact with and (suspected) exposure to covid-19: Secondary | ICD-10-CM | POA: Diagnosis present

## 2020-06-08 DIAGNOSIS — R309 Painful micturition, unspecified: Secondary | ICD-10-CM | POA: Diagnosis present

## 2020-06-08 DIAGNOSIS — R509 Fever, unspecified: Secondary | ICD-10-CM | POA: Diagnosis not present

## 2020-06-08 DIAGNOSIS — Z720 Tobacco use: Secondary | ICD-10-CM | POA: Diagnosis not present

## 2020-06-08 DIAGNOSIS — Z881 Allergy status to other antibiotic agents status: Secondary | ICD-10-CM | POA: Diagnosis not present

## 2020-06-08 DIAGNOSIS — E1165 Type 2 diabetes mellitus with hyperglycemia: Secondary | ICD-10-CM | POA: Diagnosis not present

## 2020-06-08 DIAGNOSIS — F17210 Nicotine dependence, cigarettes, uncomplicated: Secondary | ICD-10-CM | POA: Diagnosis present

## 2020-06-08 DIAGNOSIS — E119 Type 2 diabetes mellitus without complications: Secondary | ICD-10-CM

## 2020-06-08 LAB — URINALYSIS, COMPLETE (UACMP) WITH MICROSCOPIC
Bilirubin Urine: NEGATIVE
Glucose, UA: >=500 mg/dL — AB
Ketones, ur: 80 mg/dL — AB
Nitrite: NEGATIVE
Protein, ur: 100 mg/dL — AB
Specific Gravity, Urine: 1.016 (ref 1.005–1.030)
WBC, UA: >50 WBC/hpf — ABNORMAL HIGH (ref 0–5)
pH: 5 (ref 5.0–8.0)

## 2020-06-08 LAB — RESP PANEL BY RT-PCR (FLU A&B, COVID) ARPGX2
Influenza A by PCR: NEGATIVE
Influenza B by PCR: NEGATIVE
SARS Coronavirus 2 by RT PCR: NEGATIVE

## 2020-06-08 LAB — CBC
HCT: 35.9 % — ABNORMAL LOW (ref 36.0–46.0)
Hemoglobin: 12.7 g/dL (ref 12.0–15.0)
MCH: 30.5 pg (ref 26.0–34.0)
MCHC: 35.4 g/dL (ref 30.0–36.0)
MCV: 86.3 fL (ref 80.0–100.0)
Platelets: 145 10*3/uL — ABNORMAL LOW (ref 150–400)
RBC: 4.16 MIL/uL (ref 3.87–5.11)
RDW: 12.8 % (ref 11.5–15.5)
WBC: 18 10*3/uL — ABNORMAL HIGH (ref 4.0–10.5)
nRBC: 0 % (ref 0.0–0.2)

## 2020-06-08 LAB — COMPREHENSIVE METABOLIC PANEL
ALT: 11 U/L (ref 0–44)
AST: 13 U/L — ABNORMAL LOW (ref 15–41)
Albumin: 3.3 g/dL — ABNORMAL LOW (ref 3.5–5.0)
Alkaline Phosphatase: 86 U/L (ref 38–126)
Anion gap: 13 (ref 5–15)
BUN: 7 mg/dL (ref 6–20)
CO2: 16 mmol/L — ABNORMAL LOW (ref 22–32)
Calcium: 8.4 mg/dL — ABNORMAL LOW (ref 8.9–10.3)
Chloride: 99 mmol/L (ref 98–111)
Creatinine, Ser: 0.94 mg/dL (ref 0.44–1.00)
GFR, Estimated: >60 mL/min (ref >60)
Glucose, Bld: 308 mg/dL — ABNORMAL HIGH (ref 70–99)
Potassium: 4 mmol/L (ref 3.5–5.1)
Sodium: 128 mmol/L — ABNORMAL LOW (ref 135–145)
Total Bilirubin: 1.4 mg/dL — ABNORMAL HIGH (ref 0.3–1.2)
Total Protein: 7.9 g/dL (ref 6.5–8.1)

## 2020-06-08 LAB — PREGNANCY, URINE: Preg Test, Ur: NEGATIVE

## 2020-06-08 LAB — LACTIC ACID, PLASMA: Lactic Acid, Venous: 1 mmol/L (ref 0.5–1.9)

## 2020-06-08 LAB — CREATININE, SERUM
Creatinine, Ser: 0.91 mg/dL (ref 0.44–1.00)
GFR, Estimated: >60 mL/min (ref >60)

## 2020-06-08 LAB — LIPASE, BLOOD: Lipase: 22 U/L (ref 11–51)

## 2020-06-08 LAB — GLUCOSE, CAPILLARY: Glucose-Capillary: 217 mg/dL — ABNORMAL HIGH (ref 70–99)

## 2020-06-08 MED ORDER — ONDANSETRON HCL 4 MG PO TABS: 4.0000 mg | ORAL_TABLET | Freq: Four times a day (QID) | ORAL | Status: DC | PRN

## 2020-06-08 MED ORDER — MORPHINE SULFATE (PF) 4 MG/ML IV SOLN
4.0000 mg | Freq: Once | INTRAVENOUS | Status: AC
  Administered 2020-06-08: 4 mg via INTRAVENOUS
  Filled 2020-06-08: qty 1

## 2020-06-08 MED ORDER — ONDANSETRON HCL 4 MG/2ML IJ SOLN
4.0000 mg | Freq: Once | INTRAMUSCULAR | Status: AC
  Administered 2020-06-08: 4 mg via INTRAVENOUS
  Filled 2020-06-08: qty 2

## 2020-06-08 MED ORDER — LACTATED RINGERS IV BOLUS (SEPSIS)
1000.0000 mL | Freq: Once | INTRAVENOUS | Status: AC
  Administered 2020-06-08: 1000 mL via INTRAVENOUS

## 2020-06-08 MED ORDER — INSULIN ASPART 100 UNIT/ML ~~LOC~~ SOLN
0.0000 [IU] | Freq: Three times a day (TID) | SUBCUTANEOUS | Status: DC
  Administered 2020-06-09 (×3): 5 [IU] via SUBCUTANEOUS
  Administered 2020-06-10: 09:00:00 3 [IU] via SUBCUTANEOUS
  Filled 2020-06-08 (×4): qty 1

## 2020-06-08 MED ORDER — INSULIN ASPART 100 UNIT/ML ~~LOC~~ SOLN
0.0000 [IU] | Freq: Every day | SUBCUTANEOUS | Status: DC
  Administered 2020-06-09: 2 [IU] via SUBCUTANEOUS
  Filled 2020-06-08: qty 1

## 2020-06-08 MED ORDER — IOHEXOL 300 MG/ML  SOLN
100.0000 mL | Freq: Once | INTRAMUSCULAR | Status: AC | PRN
  Administered 2020-06-08: 100 mL via INTRAVENOUS
  Filled 2020-06-08: qty 100

## 2020-06-08 MED ORDER — ONDANSETRON HCL 4 MG/2ML IJ SOLN
4.0000 mg | Freq: Four times a day (QID) | INTRAMUSCULAR | Status: DC | PRN
  Administered 2020-06-09: 4 mg via INTRAVENOUS
  Filled 2020-06-08: qty 2

## 2020-06-08 MED ORDER — ACETAMINOPHEN 325 MG PO TABS
650.0000 mg | ORAL_TABLET | Freq: Four times a day (QID) | ORAL | Status: DC | PRN
  Administered 2020-06-09: 650 mg via ORAL
  Filled 2020-06-08: qty 2

## 2020-06-08 MED ORDER — ENOXAPARIN SODIUM 40 MG/0.4ML ~~LOC~~ SOLN
40.0000 mg | SUBCUTANEOUS | Status: DC
  Filled 2020-06-08 (×2): qty 0.4

## 2020-06-08 MED ORDER — LACTATED RINGERS IV SOLN: INTRAVENOUS | Status: DC

## 2020-06-08 MED ORDER — ACETAMINOPHEN 650 MG RE SUPP: 650.0000 mg | Freq: Four times a day (QID) | RECTAL | Status: DC | PRN

## 2020-06-08 MED ORDER — ACETAMINOPHEN 500 MG PO TABS
1000.0000 mg | ORAL_TABLET | Freq: Once | ORAL | Status: AC
  Administered 2020-06-08: 1000 mg via ORAL
  Filled 2020-06-08: qty 2

## 2020-06-08 MED ORDER — SODIUM CHLORIDE 0.9 % IV SOLN
1.0000 g | Freq: Once | INTRAVENOUS | Status: AC
  Administered 2020-06-08: 1 g via INTRAVENOUS
  Filled 2020-06-08: qty 1

## 2020-06-08 MED ORDER — KETOROLAC TROMETHAMINE 30 MG/ML IJ SOLN
30.0000 mg | Freq: Four times a day (QID) | INTRAMUSCULAR | Status: DC | PRN
  Administered 2020-06-09 (×2): 30 mg via INTRAVENOUS
  Filled 2020-06-08 (×2): qty 1

## 2020-06-08 MED ORDER — SODIUM CHLORIDE 0.9 % IV SOLN
1.0000 g | Freq: Three times a day (TID) | INTRAVENOUS | Status: DC
  Administered 2020-06-09 (×2): 1 g via INTRAVENOUS
  Filled 2020-06-08 (×4): qty 1

## 2020-06-08 NOTE — H&P (Signed)
History and Physical    [name]:[id number] DOB: 08/22/1985 DOA: 06/08/2020  PCP: Toy Cookey, FNP    Patient coming from: Home  I have personally briefly reviewed patient's old medical records in [facility]  Chief Complaint: Body aches, foul-smelling urine, urinary frequency  HPI: Victoria Cortez is a 34 y.o. female with medical history significant for diabetes and sickle cell trait who presents to the emergency room with a 2-day history of diffuse myalgia, subjective fevers and nausea. Denies cough or shortness of breath. Denies abdominal pain or vomiting. Has diarrhea also has urinary frequency and foul-smelling urine. ED Course: On arrival she was febrile at 102.9 tachycardic at 128 with O2 sat 100% on room air. Blood work significant for WBC of 18,000 with a lactic acid of 1. Chemistry significant for glucose 308, sodium 128, bicarb 16, total bilirubin 1.4. Urinalysis with pyuria. Lipase 22. Pregnancy test negative CT abdomen and pelvis showed findings consistent with acute pyelonephritis involving the mid and lower portions of the right kidney as well as colonic diverticulosis. Patient was started on fluid bolus  and meropenem chosen as antibiotic as patient allergic to cephalosporins. Hospitalist consulted for admission.   Review of Systems: As per HPI otherwise all other systems on review of systems negative.    Past Medical History:  Diagnosis Date  . Diabetes mellitus without complication (HCC)   . Sickle cell trait [facility])     Past Surgical History:  Procedure Laterality Date  . CESAREAN SECTION    . CESAREAN SECTION WITH BILATERAL TUBAL LIGATION N/A 02/03/2015   Procedure: CESAREAN SECTION WITH BILATERAL TUBAL LIGATION;  Surgeon: Christeen Douglas, MD;  Location: [facility] ORS;  Service: Obstetrics;  Laterality: N/A;     reports that she has been smoking cigarettes. She has a 5.50 pack-year smoking history. She has never used smokeless tobacco. She reports  current alcohol use. She reports that she does not use drugs.  Allergies  Allergen Reactions  . Rocephin [Ceftriaxone]     Family History  Problem Relation Age of Onset  . Sickle cell anemia Other   . Diabetes Father   . Sickle cell anemia Father       Prior to Admission medications   Medication Sig Start Date End Date Taking? Authorizing Provider  ibuprofen (ADVIL) 800 MG tablet Take 1 tablet by mouth every 8 (eight) hours as needed. 04/12/19   [provider]  naproxen (NAPROSYN) 500 MG tablet Take 1 tablet (500 mg total) by mouth 2 (two) times daily with a meal. 01/10/20   Joni Reining, PA-C  omeprazole (PRILOSEC) 40 MG capsule Take 1 capsule (40 mg total) by mouth daily. 01/19/20   Pabon, Diego F, MD  sulfamethoxazole-trimethoprim (BACTRIM DS) 800-160 MG tablet Take 1 tablet by mouth 2 (two) times daily. 01/10/20   Joni Reining, PA-C    Physical Exam: Vitals:   06/08/20 1256 06/08/20 1258 06/08/20 1700 06/08/20 1730  BP: 122/76  120/76 119/75  Pulse: (!) 128  (!) 121 (!) 117  Resp: 18  20 18   Temp: (!) 102.9 F (39.4 C)     TempSrc: Oral     SpO2: 100%  100% 97%  Weight:  74.8 kg    Height:  5' (1.524 m)       Vitals:   06/08/20 1256 06/08/20 1258 06/08/20 1700 06/08/20 1730  BP: 122/76  120/76 119/75  Pulse: (!) 128  (!) 121 (!) 117  Resp: 18  20 18  Temp: (!) 102.9 F (39.4 C)     TempSrc: Oral     SpO2: 100%  100% 97%  Weight:  74.8 kg    Height:  5' (1.524 m)        Constitutional: Alert and oriented x 3 . Not in any apparent distress HEENT:      Head: Normocephalic and atraumatic.         Eyes: PERLA, EOMI, Conjunctivae are normal. Sclera is non-icteric.       Mouth/Throat: Mucous membranes are moist.       Neck: Supple with no signs of meningismus. Cardiovascular: Regular rate and rhythm. No murmurs, gallops, or rubs. 2+ symmetrical distal pulses are present . No JVD. No LE edema Respiratory: Respiratory effort normal .Lungs sounds  clear bilaterally. No wheezes, crackles, or rhonchi.  Gastrointestinal: Soft, tender right flank, and non distended with positive bowel sounds. No rebound or guarding. Genitourinary:  Right CVA tenderness. Musculoskeletal: Nontender with normal range of motion in all extremities. No cyanosis, or erythema of extremities. Neurologic:  Face is symmetric. Moving all extremities. No gross focal neurologic deficits . Skin: Skin is warm, dry.  No rash or ulcers Psychiatric: Mood and affect are normal    Labs on Admission: I have personally reviewed following labs and imaging studies  CBC: Recent Labs  Lab 06/08/20 1311  WBC 18.0*  HGB 12.7  HCT 35.9*  MCV 86.3  PLT 145*   Basic Metabolic Panel: Recent Labs  Lab 06/08/20 1311  NA 128*  K 4.0  CL 99  CO2 16*  GLUCOSE 308*  BUN 7  CREATININE 0.94  CALCIUM 8.4*   GFR: Estimated Creatinine Clearance: 77.6 mL/min (by C-G formula based on SCr of 0.94 mg/dL). Liver Function Tests: Recent Labs  Lab 06/08/20 1311  AST 13*  ALT 11  ALKPHOS 86  BILITOT 1.4*  PROT 7.9  ALBUMIN 3.3*   Recent Labs  Lab 06/08/20 1311  LIPASE 22   No results for input(s): AMMONIA in the last 168 hours. Coagulation Profile: No results for input(s): INR, PROTIME in the last 168 hours. Cardiac Enzymes: No results for input(s): CKTOTAL, CKMB, CKMBINDEX, TROPONINI in the last 168 hours. BNP (last 3 results) No results for input(s): PROBNP in the last 8760 hours. HbA1C: No results for input(s): HGBA1C in the last 72 hours. CBG: No results for input(s): GLUCAP in the last 168 hours. Lipid Profile: No results for input(s): CHOL, HDL, LDLCALC, TRIG, CHOLHDL, LDLDIRECT in the last 72 hours. Thyroid Function Tests: No results for input(s): TSH, T4TOTAL, FREET4, T3FREE, THYROIDAB in the last 72 hours. Anemia Panel: No results for input(s): VITAMINB12, FOLATE, FERRITIN, TIBC, IRON, RETICCTPCT in the last 72 hours. Urine analysis:    Component Value  Date/Time   COLORURINE YELLOW (A) 06/08/2020 1311   APPEARANCEUR CLOUDY (A) 06/08/2020 1311   APPEARANCEUR Cloudy 01/24/2014 1200   LABSPEC 1.016 06/08/2020 1311   LABSPEC 1.012 01/24/2014 1200   PHURINE 5.0 06/08/2020 1311   GLUCOSEU >=500 (A) 06/08/2020 1311   GLUCOSEU Negative 01/24/2014 1200   HGBUR MODERATE (A) 06/08/2020 1311   BILIRUBINUR NEGATIVE 06/08/2020 1311   BILIRUBINUR Negative 01/24/2014 1200   KETONESUR 80 (A) 06/08/2020 1311   PROTEINUR 100 (A) 06/08/2020 1311   NITRITE NEGATIVE 06/08/2020 1311   LEUKOCYTESUR LARGE (A) 06/08/2020 1311   LEUKOCYTESUR 1+ 01/24/2014 1200    Radiological Exams on Admission: CT ABDOMEN PELVIS W CONTRAST  Result Date: 06/08/2020 CLINICAL DATA:  Generalized body aches x3 days. EXAM:  CT ABDOMEN AND PELVIS WITH CONTRAST TECHNIQUE: Multidetector CT imaging of the abdomen and pelvis was performed using the standard protocol following bolus administration of intravenous contrast. CONTRAST:  OMNIPAQUE IOHEXOL 300 MG/ML  SOLN COMPARISON:  Nov 03, 2013 FINDINGS: Lower chest: No acute abnormality. Hepatobiliary: No focal liver abnormality is seen. No gallstones, gallbladder wall thickening, or biliary dilatation. Pancreas: Unremarkable. No pancreatic ductal dilatation or surrounding inflammatory changes. Spleen: Normal in size without focal abnormality. Adrenals/Urinary Tract: Adrenal glands are unremarkable. The right kidney is normal in appearance, without renal calculi, focal lesion, or hydronephrosis. A 2.2 cm x 1.3 cm cyst is seen within the anterior aspect of the mid right kidney. A striated nephrogram is seen involving the mid and lower portions of the right kidney. A mild amount of right-sided perinephric inflammatory fat stranding is also seen. Bladder is unremarkable. Stomach/Bowel: Stomach is within normal limits. The appendix is not clearly identified. No evidence of bowel wall thickening, distention, or inflammatory changes. Noninflamed  diverticula are seen within the cecum and ascending colon. Vascular/Lymphatic: No significant vascular findings are present. No enlarged abdominal or pelvic lymph nodes. Reproductive: The uterus is normal in size and appearance. A 2.0 cm diameter cyst is seen within the left adnexa. Other: No abdominal wall hernia or abnormality. No abdominopelvic ascites. Musculoskeletal: No acute or significant osseous findings. IMPRESSION: 1. Findings consistent with acute pyelonephritis involving the mid and lower portions of the right kidney. 2. Colonic diverticulosis. 3. Small left adnexal cyst, likely ovarian in origin. Electronically Signed   By: Aram Candela M.D.   On: 06/08/2020 19:14     Assessment/Plan 34 year old female with history of diabetes and sickle cell trait who presents to the emergency room with a 2-day history of diffuse myalgia, subjective fevers and nausea.    Acute pyelonephritis   Sepsis (HCC) -Sepsis criteria includes fever of 102 tachycardia of 128 no acute pyelonephritis. Lactic acid is 1 -Completed sepsis fluid bolus. We will continue with LR at 125 mils per hour -Continue meropenem -Follow cultures    Type 2 diabetes mellitus without complication (HCC) -Sliding scale coverage    Sickle cell trait (HCC) -No acute concerns    DVT prophylaxis: Lovenox  Code Status: full code  Family Communication:  none  Disposition Plan: Back to previous home environment Consults called: none  Status:At the time of admission, it appears that the appropriate admission status for this patient is INPATIENT. This is judged to be reasonable and necessary in order to provide the required intensity of service to ensure the patient's safety given the presenting symptoms, physical exam findings, and initial radiographic and laboratory data in the context of their  Comorbid conditions.   Patient requires inpatient status due to high intensity of service, high risk for further deterioration and  high frequency of surveillance required.   I certify that at the point of admission it is my clinical judgment that the patient will require inpatient hospital care spanning beyond 2 midnights     Andris Baumann MD [facility]     06/08/2020, 7:44 PM

## 2020-06-08 NOTE — ED Notes (Signed)
Admit RN unable to take pt at this time.

## 2020-06-08 NOTE — ED Notes (Signed)
IV attempt x 1 without success, pt to CT. Will attempt again when pt returns.

## 2020-06-08 NOTE — Progress Notes (Signed)
ER Nurse notified pt can be brought up to the floor.

## 2020-06-08 NOTE — ED Notes (Signed)
RN that started IV unaware of policy to draw blood cultures prior to antibiotic administration. MD made aware. This RN unable to obtain additional IV access and blood cultures at this time. Lab called to draw blood cultures and lactic.

## 2020-06-08 NOTE — ED Provider Notes (Signed)
[facility] Emergency Department Provider Note ____________________________________________   Event Date/Time   First MD Initiated Contact with Patient 06/08/20 1612     (approximate)  I have reviewed the triage vital signs and the nursing notes.  HISTORY  Chief Complaint Generalized Body Aches   HPI Victoria Cortez is a 34 y.o. femalewho presents to the ED for evaluation of myalgias.   Chart review indicates hx sickle cell trait and diabetes.   Patient notes 2 days of diffuse myalgias, primarily.  Also reporting dysuria and urinary frequency, subjective fevers and chills, nausea and diarrhea.  Denies recent antibiotics, but does report an episode of pyelonephritis about a year ago.  She denies any upper abdominal pain, emesis, chest pain, syncope, trauma, cough or shortness of breath.  She reports about 5 episodes of watery diarrhea per day for the past 2 days without melena or hematochezia.  Reports starting her menstrual period today, which was about 2 days early, but denies additional vaginal discharge.  Currently reporting 5/10 intensity bilateral flank pain, constant and aching.   Past Medical History:  Diagnosis Date  . Diabetes mellitus without complication (HCC)   . Sickle cell trait [facility](HCC)     Patient Active Problem List   Diagnosis Date Noted  . Sepsis (HCC) 06/08/2020  . Acute pyelonephritis 06/08/2020  . Type 2 diabetes mellitus without complication (HCC) 06/08/2020  . Abnormal uterine bleeding (AUB) 01/19/2020  . Sickle cell trait (HCC) 01/19/2020  . Obesity (BMI 30-39.9) 11/13/2018  . Bacteremia 11/29/2015  . Tobacco use 05/22/2015  . Uncontrolled type 2 diabetes mellitus with hyperglycemia (HCC) 05/22/2015  . H/O cesarean section 02/03/2015  . S/P cesarean section 02/03/2015  . Uncontrolled diabetes mellitus (HCC) 01/12/2015  . Labor and delivery, indication for care 01/09/2015  . Pregnancy 11/26/2014  . Trichomonal vaginitis  09/02/2014  . History of preterm delivery 09/01/2014    Past Surgical History:  Procedure Laterality Date  . CESAREAN SECTION    . CESAREAN SECTION WITH BILATERAL TUBAL LIGATION N/A 02/03/2015   Procedure: CESAREAN SECTION WITH BILATERAL TUBAL LIGATION;  Surgeon: Christeen DouglasBethany Beasley, MD;  Location: [facility] ORS;  Service: Obstetrics;  Laterality: N/A;    Prior to Admission medications   Medication Sig Start Date End Date Taking? Authorizing Provider  ibuprofen (ADVIL) 800 MG tablet Take 1 tablet by mouth every 8 (eight) hours as needed. 04/12/19   [provider]  naproxen (NAPROSYN) 500 MG tablet Take 1 tablet (500 mg total) by mouth 2 (two) times daily with a meal. 01/10/20   Joni ReiningSmith, Ronald K, PA-C  omeprazole (PRILOSEC) 40 MG capsule Take 1 capsule (40 mg total) by mouth daily. 01/19/20   Pabon, Diego F, MD  sulfamethoxazole-trimethoprim (BACTRIM DS) 800-160 MG tablet Take 1 tablet by mouth 2 (two) times daily. 01/10/20   Joni ReiningSmith, Ronald K, PA-C    Allergies Rocephin [ceftriaxone]  Family History  Problem Relation Age of Onset  . Sickle cell anemia Other   . Diabetes Father   . Sickle cell anemia Father     Social History Social History   Tobacco Use  . Smoking status: Current Every Day Smoker    Packs/day: 0.50    Years: 11.00    Pack years: 5.50    Types: Cigarettes  . Smokeless tobacco: Never Used  Vaping Use  . Vaping Use: Never used  Substance Use Topics  . Alcohol use: Yes    Alcohol/week: 0.0 standard drinks    Comment: on weekends  .  Drug use: No    Review of Systems  Constitutional: Positive for fevers and chills Eyes: No visual changes. ENT: No sore throat. Cardiovascular: Denies chest pain. Respiratory: Denies shortness of breath. Gastrointestinal:   no vomiting. No constipation. Positive for suprapubic and bilateral flank pain, nausea and watery diarrhea. Genitourinary: Positive for dysuria. Musculoskeletal: Negative for midline back pain. Skin:  Negative for rash. Neurological: Negative for headaches, focal weakness or numbness.  ____________________________________________   PHYSICAL EXAM:  VITAL SIGNS: Vitals:   06/08/20 1700 06/08/20 1730  BP: 120/76 119/75  Pulse: (!) 121 (!) 117  Resp: 20 18  Temp:    SpO2: 100% 97%     Constitutional: Alert and oriented. No acute distress.  Warm to the touch, conversational in full sentences.  Appears uncomfortable. Eyes: Conjunctivae are normal. PERRL. EOMI. Head: Atraumatic. Nose: No congestion/rhinnorhea. Mouth/Throat: Mucous membranes are dry.  Oropharynx non-erythematous. Neck: No stridor. No cervical spine tenderness to palpation. Cardiovascular: Tachycardic rate, regular rhythm. Grossly normal heart sounds.  Good peripheral circulation. Respiratory: Normal respiratory effort.  No retractions. Lungs CTAB. Gastrointestinal: Soft , nondistended Suprapubic tenderness without peritoneal features.  Bilateral vague CVA tenderness. Musculoskeletal: No lower extremity tenderness nor edema.  No joint effusions. No signs of acute trauma. Neurologic:  Normal speech and language. No gross focal neurologic deficits are appreciated. No gait instability noted. Skin:  Skin is warm, dry and intact. No rash noted. Psychiatric: Mood and affect are normal. Speech and behavior are normal.  ____________________________________________   LABS (all labs ordered are listed, but only abnormal results are displayed)  Labs Reviewed  COMPREHENSIVE METABOLIC PANEL - Abnormal; Notable for the following components:      Result Value   Sodium 128 (*)    CO2 16 (*)    Glucose, Bld 308 (*)    Calcium 8.4 (*)    Albumin 3.3 (*)    AST 13 (*)    Total Bilirubin 1.4 (*)    All other components within normal limits  CBC - Abnormal; Notable for the following components:   WBC 18.0 (*)    HCT 35.9 (*)    Platelets 145 (*)    All other components within normal limits  URINALYSIS, COMPLETE (UACMP)  WITH MICROSCOPIC - Abnormal; Notable for the following components:   Color, Urine YELLOW (*)    APPearance CLOUDY (*)    Glucose, UA >=500 (*)    Hgb urine dipstick MODERATE (*)    Ketones, ur 80 (*)    Protein, ur 100 (*)    Leukocytes,Ua LARGE (*)    WBC, UA >50 (*)    Bacteria, UA RARE (*)    All other components within normal limits  CULTURE, BLOOD (SINGLE)  RESP PANEL BY RT-PCR (FLU A&B, COVID) ARPGX2  LIPASE, BLOOD  LACTIC ACID, PLASMA  PREGNANCY, URINE  LACTIC ACID, PLASMA  HEMOGLOBIN A1C  HIV ANTIBODY (ROUTINE TESTING W REFLEX)  PROTIME-INR  CORTISOL-AM, BLOOD  PROCALCITONIN  CREATININE, SERUM  BASIC METABOLIC PANEL  CBC   ____________________________________________  RADIOLOGY  ED MD interpretation: CT abdomen/pelvis reviewed by me with bladder wall thickening  Official radiology report(s): CT ABDOMEN PELVIS W CONTRAST  Result Date: 06/08/2020 CLINICAL DATA:  Generalized body aches x3 days. EXAM: CT ABDOMEN AND PELVIS WITH CONTRAST TECHNIQUE: Multidetector CT imaging of the abdomen and pelvis was performed using the standard protocol following bolus administration of intravenous contrast. CONTRAST:  OMNIPAQUE IOHEXOL 300 MG/ML  SOLN COMPARISON:  Nov 03, 2013 FINDINGS: Lower chest: No  acute abnormality. Hepatobiliary: No focal liver abnormality is seen. No gallstones, gallbladder wall thickening, or biliary dilatation. Pancreas: Unremarkable. No pancreatic ductal dilatation or surrounding inflammatory changes. Spleen: Normal in size without focal abnormality. Adrenals/Urinary Tract: Adrenal glands are unremarkable. The right kidney is normal in appearance, without renal calculi, focal lesion, or hydronephrosis. A 2.2 cm x 1.3 cm cyst is seen within the anterior aspect of the mid right kidney. A striated nephrogram is seen involving the mid and lower portions of the right kidney. A mild amount of right-sided perinephric inflammatory fat stranding is also seen. Bladder  is unremarkable. Stomach/Bowel: Stomach is within normal limits. The appendix is not clearly identified. No evidence of bowel wall thickening, distention, or inflammatory changes. Noninflamed diverticula are seen within the cecum and ascending colon. Vascular/Lymphatic: No significant vascular findings are present. No enlarged abdominal or pelvic lymph nodes. Reproductive: The uterus is normal in size and appearance. A 2.0 cm diameter cyst is seen within the left adnexa. Other: No abdominal wall hernia or abnormality. No abdominopelvic ascites. Musculoskeletal: No acute or significant osseous findings. IMPRESSION: 1. Findings consistent with acute pyelonephritis involving the mid and lower portions of the right kidney. 2. Colonic diverticulosis. 3. Small left adnexal cyst, likely ovarian in origin. Electronically Signed   By: Aram Candela M.D.   On: 06/08/2020 19:14    ____________________________________________   PROCEDURES and INTERVENTIONS  Procedure(s) performed (including Critical Care):  .Critical Care Performed by: Delton Prairie, MD Authorized by: Delton Prairie, MD   Critical care provider statement:    Critical care time (minutes):  45   Critical care was necessary to treat or prevent imminent or life-threatening deterioration of the following conditions:  Sepsis   Critical care was time spent personally by me on the following activities:  Discussions with consultants, evaluation of patient's response to treatment, examination of patient, ordering and performing treatments and interventions, ordering and review of laboratory studies, ordering and review of radiographic studies, pulse oximetry, re-evaluation of patient's condition, obtaining history from patient or surrogate and review of old charts .1-3 Lead EKG Interpretation Performed by: Delton Prairie, MD Authorized by: Delton Prairie, MD     Interpretation: abnormal     ECG rate:  120   ECG rate assessment: tachycardic      Rhythm: sinus tachycardia     Ectopy: none     Conduction: normal      Medications  lactated ringers infusion (has no administration in time range)  insulin aspart (novoLOG) injection 0-15 Units (has no administration in time range)  insulin aspart (novoLOG) injection 0-5 Units (has no administration in time range)  enoxaparin (LOVENOX) injection 40 mg (has no administration in time range)  lactated ringers infusion (has no administration in time range)  acetaminophen (TYLENOL) tablet 650 mg (has no administration in time range)    Or  acetaminophen (TYLENOL) suppository 650 mg (has no administration in time range)  ketorolac (TORADOL) 30 MG/ML injection 30 mg (has no administration in time range)  ondansetron (ZOFRAN) tablet 4 mg (has no administration in time range)    Or  ondansetron (ZOFRAN) injection 4 mg (has no administration in time range)  lactated ringers bolus 1,000 mL (1,000 mLs Intravenous New Bag/Given 06/08/20 1806)    And  lactated ringers bolus 1,000 mL (1,000 mLs Intravenous New Bag/Given 06/08/20 1714)  meropenem (MERREM) 1 g in sodium chloride 0.9 % 100 mL IVPB (1 g Intravenous New Bag/Given 06/08/20 1716)  acetaminophen (TYLENOL) tablet 1,000 mg (1,000 mg  Oral Given 06/08/20 1715)  ondansetron (ZOFRAN) injection 4 mg (4 mg Intravenous Given 06/08/20 1801)  morphine 4 MG/ML injection 4 mg (4 mg Intravenous Given 06/08/20 1803)  iohexol (OMNIPAQUE) 300 MG/ML solution 100 mL (100 mLs Intravenous Contrast Given 06/08/20 1827)    ____________________________________________   MDM / ED COURSE   34 year old patient with history of sickle cell trait presents with evidence of sepsis attributed pyelonephritis requiring medical admission.  Patient febrile and tachycardic, but hemodynamically stable not hypoxic on room air.  Exam demonstrates stigmata of dehydration and uncomfortable atropine patient who is in no distress.  No peritoneal features to her abdomen, but expected  tenderness to her suprapubic region considering UTI.  Urine with infectious features.  Blood work with stigmata of dehydration and leukocytosis, but no lactic acidosis.  Blood and urine cultures were drawn and patient was provided meropenem, due to her Rocephin allergy.  CT imaging shows no evidence of ureteral obstruction or additional intra-abdominal pathology to cause her symptoms such as SBO.  Patient remained stable in the ED, improving hemodynamics after IV fluids and resuscitation.  We will admit to hospitalist medicine for further work-up management.   Clinical Course as of 06/08/20 1956  Thu Jun 08, 2020  1939 Spoke with Dr. Para March, who will see patient for admission. [DS]    Clinical Course User Index [DS] Delton Prairie, MD    ____________________________________________   FINAL CLINICAL IMPRESSION(S) / ED DIAGNOSES  Final diagnoses:  Sepsis without acute organ dysfunction, due to unspecified organism [facility])  Pyelonephritis     ED Discharge Orders    None       [place]   Note:  This document was prepared using Dragon voice recognition software and may include unintentional dictation errors.   Delton Prairie, MD 06/08/20 [phone]

## 2020-06-08 NOTE — Progress Notes (Signed)
Pharmacy Antibiotic Note  Victoria Cortez is a 34 y.o. female admitted on 06/08/2020 with UTI.  Pharmacy has been consulted for Meropenem dosing. Patient with allergy to Ceftriaxone.  Plan: Meropenem 1g IV q8h, follow up on cultures.  Height: 5' (152.4 cm) Weight: 74.8 kg (165 lb) IBW/kg (Calculated) : 45.5  Temp (24hrs), Avg:101 F (38.3 C), Min:99 F (37.2 C), Max:102.9 F (39.4 C)  Recent Labs  Lab 06/08/20 1311 06/08/20 1844  WBC 18.0*  --   CREATININE 0.94  --   LATICACIDVEN  --  1.0    Estimated Creatinine Clearance: 77.6 mL/min (by C-G formula based on SCr of 0.94 mg/dL).    Allergies  Allergen Reactions  . Rocephin [Ceftriaxone]     Antimicrobials this admission: Meropenem 12/9 >>   Dose adjustments this admission:  Microbiology results: 12/9 BCx: pending 12/9 UCx: pending   Thank you for allowing pharmacy to be a part of this patient's care.  Clovia Cortez, PharmD, BCPS 06/08/2020 8:40 PM

## 2020-06-08 NOTE — Sepsis Progress Note (Signed)
Sepsis protocol is being followed by [facility]. 

## 2020-06-08 NOTE — ED Triage Notes (Signed)
Pt via pov from home with generalized body aches x 3 days. Pt states she has sickle cell trait, but not sickle cell disease. She reports being very thirsty, smelly urine. Denies frequency, endorses some pain upon urination. Pt alert & oriented, nad noted.

## 2020-06-08 NOTE — Progress Notes (Signed)
PHARMACY -  BRIEF ANTIBIOTIC NOTE   Pharmacy has received consult(s) for Meropenem from an ED provider.  The patient's profile has been reviewed for ht/wt/allergies/indication/available labs.    One time order(s) placed for Meropenem 1g by ED provider.  Further antibiotics/pharmacy consults should be ordered by admitting physician if indicated.                       Thank you, Foye Deer 06/08/2020  4:33 PM

## 2020-06-08 NOTE — Progress Notes (Signed)
CODE SEPSIS - PHARMACY COMMUNICATION  **Broad Spectrum Antibiotics should be administered within 1 hour of Sepsis diagnosis**  Time Code Sepsis Called/Page Received: 16:18  Antibiotics Ordered: Meropenem  Time of 1st antibiotic administration: Meropenem given at 17:16  Additional action taken by pharmacy: n/a  If necessary, Name of Provider/Nurse Contacted: n/a    Foye Deer ,PharmD Clinical Pharmacist  06/08/2020  4:33 PM

## 2020-06-09 DIAGNOSIS — D573 Sickle-cell trait: Secondary | ICD-10-CM

## 2020-06-09 DIAGNOSIS — E1165 Type 2 diabetes mellitus with hyperglycemia: Secondary | ICD-10-CM

## 2020-06-09 DIAGNOSIS — A419 Sepsis, unspecified organism: Principal | ICD-10-CM

## 2020-06-09 DIAGNOSIS — E871 Hypo-osmolality and hyponatremia: Secondary | ICD-10-CM

## 2020-06-09 LAB — GLUCOSE, CAPILLARY
Glucose-Capillary: 225 mg/dL — ABNORMAL HIGH (ref 70–99)
Glucose-Capillary: 240 mg/dL — ABNORMAL HIGH (ref 70–99)
Glucose-Capillary: 217 mg/dL — ABNORMAL HIGH (ref 70–99)
Glucose-Capillary: 202 mg/dL — ABNORMAL HIGH (ref 70–99)

## 2020-06-09 LAB — PROTIME-INR
INR: 1.2 (ref 0.8–1.2)
Prothrombin Time: 15 seconds (ref 11.4–15.2)

## 2020-06-09 LAB — CBC
HCT: 33.2 % — ABNORMAL LOW (ref 36.0–46.0)
Hemoglobin: 11.7 g/dL — ABNORMAL LOW (ref 12.0–15.0)
MCH: 30.5 pg (ref 26.0–34.0)
MCHC: 35.2 g/dL (ref 30.0–36.0)
MCV: 86.7 fL (ref 80.0–100.0)
Platelets: 151 10*3/uL (ref 150–400)
RBC: 3.83 MIL/uL — ABNORMAL LOW (ref 3.87–5.11)
RDW: 12.8 % (ref 11.5–15.5)
WBC: 13.3 10*3/uL — ABNORMAL HIGH (ref 4.0–10.5)
nRBC: 0 % (ref 0.0–0.2)

## 2020-06-09 LAB — BASIC METABOLIC PANEL
Anion gap: 11 (ref 5–15)
BUN: 9 mg/dL (ref 6–20)
CO2: 20 mmol/L — ABNORMAL LOW (ref 22–32)
Calcium: 8.2 mg/dL — ABNORMAL LOW (ref 8.9–10.3)
Chloride: 102 mmol/L (ref 98–111)
Creatinine, Ser: 0.89 mg/dL (ref 0.44–1.00)
GFR, Estimated: >60 mL/min (ref >60)
Glucose, Bld: 260 mg/dL — ABNORMAL HIGH (ref 70–99)
Potassium: 3.7 mmol/L (ref 3.5–5.1)
Sodium: 133 mmol/L — ABNORMAL LOW (ref 135–145)

## 2020-06-09 LAB — HEMOGLOBIN A1C
Hgb A1c MFr Bld: 11.4 % — ABNORMAL HIGH (ref 4.8–5.6)
Mean Plasma Glucose: 280.48 mg/dL

## 2020-06-09 LAB — CORTISOL-AM, BLOOD: Cortisol - AM: 12.1 ug/dL (ref 6.7–22.6)

## 2020-06-09 LAB — PROCALCITONIN: Procalcitonin: 1.16 ng/mL

## 2020-06-09 LAB — HIV ANTIBODY (ROUTINE TESTING W REFLEX): HIV Screen 4th Generation wRfx: NONREACTIVE

## 2020-06-09 MED ORDER — PIPERACILLIN-TAZOBACTAM 3.375 G IVPB
3.3750 g | Freq: Three times a day (TID) | INTRAVENOUS | Status: DC
  Administered 2020-06-09 – 2020-06-10 (×3): 3.375 g via INTRAVENOUS
  Filled 2020-06-09 (×3): qty 50

## 2020-06-09 MED ORDER — LIVING WELL WITH DIABETES BOOK
Freq: Once | Status: AC
  Filled 2020-06-09: qty 1

## 2020-06-09 MED ORDER — INSULIN GLARGINE 100 UNIT/ML ~~LOC~~ SOLN
8.0000 [IU] | Freq: Every day | SUBCUTANEOUS | Status: DC
  Administered 2020-06-09: 8 [IU] via SUBCUTANEOUS
  Filled 2020-06-09 (×3): qty 0.08

## 2020-06-09 NOTE — Progress Notes (Signed)
   06/09/20 0045  Assess: MEWS Score  Temp (!) 102.2 F (39 C)  BP 121/68  Pulse Rate (!) 109  Resp 18  SpO2 99 %  O2 Device Room Air  Assess: MEWS Score  MEWS Temp 2  MEWS Systolic 0  MEWS Pulse 1  MEWS RR 0  MEWS LOC 0  MEWS Score 3  MEWS Score Color Yellow  Assess: if the MEWS score is Yellow or Red  Were vital signs taken at a resting state? Yes  Focused Assessment No change from prior assessment  Early Detection of Sepsis Score *See Row Information* High  MEWS guidelines implemented *See Row Information* Yes  Treat  MEWS Interventions Administered prn meds/treatments;Escalated (See documentation below)  Pain Scale 0-10  Pain Score 8  Pain Type Acute pain  Pain Location Abdomen  Pain Orientation Left;Lower  Pain Descriptors / Indicators Aching  Multiple Pain Sites No  Take Vital Signs  Increase Vital Sign Frequency  Yellow: Q 2hr X 2 then Q 4hr X 2, if remains yellow, continue Q 4hrs  Escalate  MEWS: Escalate Yellow: discuss with charge nurse/RN and consider discussing with provider and RRT  Notify: Charge Nurse/RN  Name of Charge Nurse/RN Notified Chrystal  Date Charge Nurse/RN Notified 06/08/20  Time Charge Nurse/RN Notified 0100  Document  Patient Outcome Stabilized after interventions

## 2020-06-09 NOTE — Plan of Care (Signed)
Pt axox4. Calm and cooperative and able to voice her needs. Pt Tmax 102.84F. Yellow mews in effect see mews interventions. C/o of abdominal pain, effective with Toradol IV. Pt able to ambulate and void. Pt receiving IVF and IV Abx. Safety measures in place. Will continue to monitor.  Problem: Education: Goal: Knowledge of General Education information will improve Description: Including pain rating scale, medication(s)/side effects and non-pharmacologic comfort measures Outcome: Progressing   Problem: Health Behavior/Discharge Planning: Goal: Ability to manage health-related needs will improve Outcome: Progressing   Problem: Clinical Measurements: Goal: Ability to maintain clinical measurements within normal limits will improve Outcome: Progressing Goal: Will remain free from infection Outcome: Progressing Goal: Diagnostic test results will improve Outcome: Progressing Goal: Respiratory complications will improve Outcome: Progressing Goal: Cardiovascular complication will be avoided Outcome: Progressing   Problem: Activity: Goal: Risk for activity intolerance will decrease Outcome: Progressing   Problem: Nutrition: Goal: Adequate nutrition will be maintained Outcome: Progressing   Problem: Coping: Goal: Level of anxiety will decrease Outcome: Progressing   Problem: Elimination: Goal: Will not experience complications related to bowel motility Outcome: Progressing Goal: Will not experience complications related to urinary retention Outcome: Progressing   Problem: Pain Managment: Goal: General experience of comfort will improve Outcome: Progressing   Problem: Safety: Goal: Ability to remain free from injury will improve Outcome: Progressing   Problem: Skin Integrity: Goal: Risk for impaired skin integrity will decrease Outcome: Progressing

## 2020-06-09 NOTE — Progress Notes (Signed)
Inpatient Diabetes Program Recommendations  AACE/ADA: New Consensus Statement on Inpatient Glycemic Control (2015)  Target Ranges:  Prepandial:   less than 140 mg/dL      Peak postprandial:   less than 180 mg/dL (1-2 hours)      Critically ill patients:  140 - 180 mg/dL   Lab Results  Component Value Date   GLUCAP 240 (H) 06/09/2020   HGBA1C 11.4 (H) 06/08/2020    Spoke with patient regarding elevated A1C.  She admits that she has not taken any medications for diabetes in over 2 years.  She states that her PCP ordered both metformin and insulin and she did not get them filled.  She did have gestational DM and therefore has been on insulin in the past and is very familiar with use of insulin pen.  We discussed goal A1C, goal blood sugars, hypo and hyperglycemia and why it is so important to manage blood sugars. She is a busy mom of 3- encouraged her and told her she has to take care of herself.  We also discussed possible diet changes.  She does drink regular pepsi.  Encouraged her to stop drinking sodas and also reviewed the plate method with her.  Will order Living well with DM booklet at d/c as well.   At d/c patient will need:   Glucose meter (order #[id number]) Lantus solostar pen [phone]) Metformin  Insulin pen needles (#[id number])  Thanks,  Beryl Meager, RN, BC-ADM Inpatient Diabetes Coordinator Pager [phone] (8a-5p)

## 2020-06-09 NOTE — Progress Notes (Signed)
Patient ID: Victoria Cortez, female   DOB: 04/15/1986, 34 y.o.   MRN: [id number] [facility] PROGRESS NOTE  Victoria Cortez YHC:[id number] DOB: [birth year] DOA: 06/08/2020 PCP: Toy Cookey, FNP  HPI/Subjective: Patient feeling better than yesterday.  Had fever chills and sweats yesterday.  Had burning on urination and foul-smelling urine.  No pain in the back.  Admitted with pyelonephritis and high fever.  Objective: Vitals:   06/09/20 0828 06/09/20 1204  BP: 114/75 124/76  Pulse: 93 98  Resp: 16 17  Temp:  98 F (36.7 C)  SpO2: 100% 100%    Intake/Output Summary (Last 24 hours) at 06/09/2020 1421 Last data filed at 06/09/2020 1037 Gross per 24 hour  Intake 360 ml  Output 0 ml  Net 360 ml   Filed Weights   06/08/20 1258  Weight: 74.8 kg    ROS: Review of Systems  Respiratory: Negative for cough and shortness of breath.   Cardiovascular: Negative for chest pain.  Gastrointestinal: Negative for abdominal pain, nausea and vomiting.  Genitourinary: Positive for dysuria.   Exam: Physical Exam HENT:     Head: Normocephalic.     Mouth/Throat:     Pharynx: No oropharyngeal exudate.  Eyes:     General: Lids are normal.     Conjunctiva/sclera: Conjunctivae normal.     Pupils: Pupils are equal, round, and reactive to light.  Cardiovascular:     Rate and Rhythm: Normal rate and regular rhythm.     Heart sounds: Normal heart sounds, S1 normal and S2 normal.  Pulmonary:     Breath sounds: No decreased breath sounds, wheezing, rhonchi or rales.  Abdominal:     Palpations: Abdomen is soft.     Tenderness: There is no abdominal tenderness.  Musculoskeletal:     Right lower leg: No swelling.     Left lower leg: No swelling.  Skin:    General: Skin is warm.     Findings: No rash.  Neurological:     Mental Status: She is alert and oriented to person, place, and time.       Data Reviewed: Basic Metabolic Panel: Recent Labs  Lab 06/08/20 1311  06/08/20 2056 06/09/20 0444  NA 128*  --  133*  K 4.0  --  3.7  CL 99  --  102  CO2 16*  --  20*  GLUCOSE 308*  --  260*  BUN 7  --  9  CREATININE 0.94 0.91 0.89  CALCIUM 8.4*  --  8.2*   Liver Function Tests: Recent Labs  Lab 06/08/20 1311  AST 13*  ALT 11  ALKPHOS 86  BILITOT 1.4*  PROT 7.9  ALBUMIN 3.3*   Recent Labs  Lab 06/08/20 1311  LIPASE 22   CBC: Recent Labs  Lab 06/08/20 1311 06/09/20 0444  WBC 18.0* 13.3*  HGB 12.7 11.7*  HCT 35.9* 33.2*  MCV 86.3 86.7  PLT 145* 151    CBG: Recent Labs  Lab 06/08/20 2131 06/09/20 0756 06/09/20 1205  GLUCAP 217* 225* 240*    Recent Results (from the past 240 hour(s))  Resp Panel by RT-PCR (Flu A&B, Covid) Nasopharyngeal Swab     Status: None   Collection Time: 06/08/20  6:12 PM   Specimen: Nasopharyngeal Swab; Nasopharyngeal(NP) swabs in vial transport medium  Result Value Ref Range Status   SARS Coronavirus 2 by RT PCR NEGATIVE NEGATIVE Final    Comment: (NOTE) SARS-CoV-2 target nucleic acids are NOT DETECTED.  The SARS-CoV-2 RNA is  generally detectable in upper respiratory specimens during the acute phase of infection. The lowest concentration of SARS-CoV-2 viral copies this assay can detect is 138 copies/mL. A negative result does not preclude SARS-Cov-2 infection and should not be used as the sole basis for treatment or other patient management decisions. A negative result may occur with  improper specimen collection/handling, submission of specimen other than nasopharyngeal swab, presence of viral mutation(s) within the areas targeted by this assay, and inadequate number of viral copies(<138 copies/mL). A negative result must be combined with clinical observations, patient history, and epidemiological information. The expected result is Negative.  Fact Sheet for Patients:  BloggerCourse.com  Fact Sheet for Healthcare Providers:   SeriousBroker.it  This test is no t yet approved or cleared by the [place] FDA and  has been authorized for detection and/or diagnosis of SARS-CoV-2 by FDA under an Emergency Use Authorization (EUA). This EUA will remain  in effect (meaning this test can be used) for the duration of the COVID-19 declaration under Section 564(b)(1) of the Act, 21 U.S.C.section 360bbb-3(b)(1), unless the authorization is terminated  or revoked sooner.       Influenza A by PCR NEGATIVE NEGATIVE Final   Influenza B by PCR NEGATIVE NEGATIVE Final    Comment: (NOTE) The Xpert Xpress SARS-CoV-2/FLU/RSV plus assay is intended as an aid in the diagnosis of influenza from Nasopharyngeal swab specimens and should not be used as a sole basis for treatment. Nasal washings and aspirates are unacceptable for Xpert Xpress SARS-CoV-2/FLU/RSV testing.  Fact Sheet for Patients: BloggerCourse.com  Fact Sheet for Healthcare Providers: SeriousBroker.it  This test is not yet approved or cleared by the [place] FDA and has been authorized for detection and/or diagnosis of SARS-CoV-2 by FDA under an Emergency Use Authorization (EUA). This EUA will remain in effect (meaning this test can be used) for the duration of the COVID-19 declaration under Section 564(b)(1) of the Act, 21 U.S.C. section 360bbb-3(b)(1), unless the authorization is terminated or revoked.  Performed at [facility], [address] [place], [place] [address]   Culture, blood (single)     Status: None (Preliminary result)   Collection Time: 06/08/20  6:45 PM   Specimen: BLOOD  Result Value Ref Range Status   Specimen Description BLOOD BLOOD RIGHT HAND  Final   Special Requests   Final    BOTTLES DRAWN AEROBIC AND ANAEROBIC Blood Culture adequate volume   Culture   Final    NO GROWTH < 12 HOURS Performed at [facility], [address], [place], [place] [address]    Report Status PENDING  Incomplete     Studies: CT ABDOMEN PELVIS W CONTRAST  Result Date: 06/08/2020 CLINICAL DATA:  Generalized body aches x3 days. EXAM: CT ABDOMEN AND PELVIS WITH CONTRAST TECHNIQUE: Multidetector CT imaging of the abdomen and pelvis was performed using the standard protocol following bolus administration of intravenous contrast. CONTRAST:  OMNIPAQUE IOHEXOL 300 MG/ML  SOLN COMPARISON:  Nov 03, 2013 FINDINGS: Lower chest: No acute abnormality. Hepatobiliary: No focal liver abnormality is seen. No gallstones, gallbladder wall thickening, or biliary dilatation. Pancreas: Unremarkable. No pancreatic ductal dilatation or surrounding inflammatory changes. Spleen: Normal in size without focal abnormality. Adrenals/Urinary Tract: Adrenal glands are unremarkable. The right kidney is normal in appearance, without renal calculi, focal lesion, or hydronephrosis. A 2.2 cm x 1.3 cm cyst is seen within the anterior aspect of the mid right kidney. A striated nephrogram is seen involving the mid and lower  portions of the right kidney. A mild amount of right-sided perinephric inflammatory fat stranding is also seen. Bladder is unremarkable. Stomach/Bowel: Stomach is within normal limits. The appendix is not clearly identified. No evidence of bowel wall thickening, distention, or inflammatory changes. Noninflamed diverticula are seen within the cecum and ascending colon. Vascular/Lymphatic: No significant vascular findings are present. No enlarged abdominal or pelvic lymph nodes. Reproductive: The uterus is normal in size and appearance. A 2.0 cm diameter cyst is seen within the left adnexa. Other: No abdominal wall hernia or abnormality. No abdominopelvic ascites. Musculoskeletal: No acute or significant osseous findings. IMPRESSION: 1. Findings consistent with acute pyelonephritis involving the mid and lower portions of the right kidney. 2. Colonic diverticulosis. 3.  Small left adnexal cyst, likely ovarian in origin. Electronically Signed   By: Aram Candela M.D.   On: 06/08/2020 19:14    Scheduled Meds: . enoxaparin (LOVENOX) injection  40 mg Subcutaneous Q24H  . insulin aspart  0-15 Units Subcutaneous TID WC  . insulin aspart  0-5 Units Subcutaneous QHS  . insulin glargine  8 Units Subcutaneous QHS   Continuous Infusions: . lactated ringers 50 mL/hr at 06/09/20 1253  . piperacillin-tazobactam (ZOSYN)  IV      Assessment/Plan:  1. Clinical sepsis present on admission with acute pyelonephritis, fever, tachycardia and leukocytosis.  Continue IV fluids.  On empiric antibiotics.  Urine culture was not sent from the emergency room.  Since they did have a urine from the emergency room we were able to add on a urine culture from the urine that was already collected but now it may take 2 days from now to result. 2. Type 2 diabetes mellitus with hemoglobin A1c being 11.4.  Start low-dose Lantus.  Patient on sliding scale. 3. Sickle cell trait 4. Hyponatremia improving with IV fluids.  Sodium came up from 128 to 133.     Code Status:     Code Status Orders  (From admission, onward)         Start     Ordered   06/08/20 1942  Full code  Continuous        06/08/20 1944        Code Status History    Date Active Date Inactive Code Status Order ID Comments User Context   11/29/2015 0944 11/30/2015 1458 Full Code [id number]  Milagros Loll, MD ED   02/03/2015 1413 02/07/2015 1941 Full Code [id number]  Christeen Douglas, MD Inpatient   01/12/2015 1231 01/17/2015 1303 Full Code [id number]  Ala Dach, MD Inpatient   01/09/2015 2053 01/10/2015 0219 Full Code [id number]  Vernie Ammons, RN Inpatient   11/26/2014 1832 11/27/2014 0055 Full Code [id number]  Argentina Ponder, RN Inpatient   Advance Care Planning Activity     Disposition Plan: Status is: Inpatient  Dispo: The patient is from: Home              Anticipated d/c is to: Home               Anticipated d/c date is: Potential disposition once temperature curve comes down              Patient currently being treated for clinical sepsis with acute pyelonephritis.  Antibiotics:  Zosyn  Time spent: 27 minutes  Emira [employer]

## 2020-06-09 NOTE — Progress Notes (Signed)
Inpatient Diabetes Program Recommendations  AACE/ADA: New Consensus Statement on Inpatient Glycemic Control (2015)  Target Ranges:  Prepandial:   less than 140 mg/dL      Peak postprandial:   less than 180 mg/dL (1-2 hours)      Critically ill patients:  140 - 180 mg/dL   Results for Victoria Cortez, Victoria Cortez (MRN [id number]) as of 06/09/2020 11:52  Ref. Range 06/08/2020 21:31 06/09/2020 07:56  Glucose-Capillary Latest Ref Range: 70 - 99 mg/dL 678 (H) 938 (H)  5 units NOVOLOG    Results for Victoria Cortez, Victoria Cortez (MRN [id number]) as of 06/09/2020 11:52  Ref. Range 06/08/2020 20:56  Hemoglobin A1C Latest Ref Range: 4.8 - 5.6 % 11.4 (H)  (280 mg/dl)    Admit with: Body aches, foul-smelling urine, urinary frequency         Acute pyelonephritis/ Sepsis   History: DM  Home DM Meds: None listed  Current Orders: Novolog Moderate Correction Scale/ SSI (0-15 units) TID AC + HS   PCP: Dr. Toy Cookey [facility])    MD- Note Novolog SSI started this AM  Current A1c of 11.4% indicates very poor glucose control at home.  No diabetes meds listed in home med rec.  Will definitely need meds for home.  If AM CBGs remain elevated, may consider addition of basal insulin to hospital insulin regimen.  Could start with Lantus 8 units Daily (0.1 units/kg)     --Will follow patient during hospitalization--  Ambrose Finland RN, MSN, CDE Diabetes Coordinator Inpatient Glycemic Control Team Team Pager: [phone] (8a-5p)

## 2020-06-10 DIAGNOSIS — Z720 Tobacco use: Secondary | ICD-10-CM

## 2020-06-10 LAB — BASIC METABOLIC PANEL
Anion gap: 10 (ref 5–15)
BUN: 9 mg/dL (ref 6–20)
CO2: 19 mmol/L — ABNORMAL LOW (ref 22–32)
Calcium: 8.1 mg/dL — ABNORMAL LOW (ref 8.9–10.3)
Chloride: 103 mmol/L (ref 98–111)
Creatinine, Ser: 0.84 mg/dL (ref 0.44–1.00)
GFR, Estimated: >60 mL/min (ref >60)
Glucose, Bld: 212 mg/dL — ABNORMAL HIGH (ref 70–99)
Potassium: 3.6 mmol/L (ref 3.5–5.1)
Sodium: 132 mmol/L — ABNORMAL LOW (ref 135–145)

## 2020-06-10 LAB — CBC
HCT: 30.3 % — ABNORMAL LOW (ref 36.0–46.0)
Hemoglobin: 11 g/dL — ABNORMAL LOW (ref 12.0–15.0)
MCH: 31 pg (ref 26.0–34.0)
MCHC: 36.3 g/dL — ABNORMAL HIGH (ref 30.0–36.0)
MCV: 85.4 fL (ref 80.0–100.0)
Platelets: 170 10*3/uL (ref 150–400)
RBC: 3.55 MIL/uL — ABNORMAL LOW (ref 3.87–5.11)
RDW: 12.8 % (ref 11.5–15.5)
WBC: 7.9 10*3/uL (ref 4.0–10.5)
nRBC: 0 % (ref 0.0–0.2)

## 2020-06-10 LAB — URINE CULTURE: Special Requests: NORMAL

## 2020-06-10 LAB — GLUCOSE, CAPILLARY: Glucose-Capillary: 214 mg/dL — ABNORMAL HIGH (ref 70–99)

## 2020-06-10 MED ORDER — METFORMIN HCL 500 MG PO TABS: 500.0000 mg | ORAL_TABLET | Freq: Two times a day (BID) | ORAL | 0 refills | Status: AC

## 2020-06-10 MED ORDER — BLOOD GLUCOSE MONITOR KIT: PACK | 0 refills | Status: AC

## 2020-06-10 MED ORDER — INSULIN PEN NEEDLE 32G X 4 MM MISC: 1.0000 | Freq: Every day | 0 refills | Status: AC

## 2020-06-10 MED ORDER — INSULIN GLARGINE 100 UNIT/ML SOLOSTAR PEN: 12.0000 [IU] | PEN_INJECTOR | Freq: Every day | SUBCUTANEOUS | 0 refills | Status: AC

## 2020-06-10 MED ORDER — SULFAMETHOXAZOLE-TRIMETHOPRIM 800-160 MG PO TABS: 1.0000 | ORAL_TABLET | Freq: Two times a day (BID) | ORAL | 0 refills | Status: AC

## 2020-06-10 NOTE — Discharge Instructions (Signed)
Pyelonephritis, Adult  Pyelonephritis is an infection that occurs in the kidney. The kidneys are organs that help clean the blood by moving waste out of the blood and into the pee (urine). This infection can happen quickly, or it can last for a long time. In most cases, it clears up with treatment and does not cause other problems. What are the causes? This condition may be caused by:  Germs (bacteria) going from the bladder up to the kidney. This may happen after having a bladder infection.  Germs going from the blood to the kidney. What increases the risk? This condition is more likely to develop in:  Pregnant women.  Older people.  People who have any of these conditions: ? Diabetes. ? Inflammation of the prostate gland (prostatitis), in males. ? Kidney stones or bladder stones. ? Other problems with the kidney or the parts of your body that carry pee from the kidneys to the bladder (ureters). ? Cancer.  People who have a small, thin tube (catheter) placed in the bladder.  People who are sexually active.  Women who use a medicine that kills sperm (spermicide) to prevent pregnancy.  People who have had a prior urinary tract infection (UTI). What are the signs or symptoms? Symptoms of this condition include:  Peeing often.  A strong urge to pee right away.  Burning or stinging when peeing.  Belly pain.  Back pain.  Pain in the side (flank area).  Fever or chills.  Blood in the pee, or dark pee.  Feeling sick to your stomach (nauseous) or throwing up (vomiting). How is this treated? This condition may be treated by:  Taking antibiotic medicines by mouth (orally).  Drinking enough fluids. If the infection is bad, you may need to stay in the hospital. You may be given antibiotics and fluids that are put directly into a vein through an IV tube. In some cases, other treatments may be needed. Follow these instructions at home: Medicines  Take your antibiotic  medicine as told by your doctor. Do not stop taking the antibiotic even if you start to feel better.  Take over-the-counter and prescription medicines only as told by your doctor. General instructions   Drink enough fluid to keep your pee pale yellow.  Avoid caffeine, tea, and carbonated drinks.  Pee (urinate) often. Avoid holding in pee for long periods of time.  Pee before and after sex.  After pooping (having a bowel movement), women should wipe from front to back. Use each tissue only once.  Keep all follow-up visits as told by your doctor. This is important. Contact a doctor if:  You do not feel better after 2 days.  Your symptoms get worse.  You have a fever. Get help right away if:  You cannot take your medicine or drink fluids as told.  You have chills and shaking.  You throw up.  You have very bad pain in your side or back.  You feel very weak or you pass out (faint). Summary  Pyelonephritis is an infection that occurs in the kidney.  In most cases, this infection clears up with treatment and does not cause other problems.  Take your antibiotic medicine as told by your doctor. Do not stop taking the antibiotic even if you start to feel better.  Drink enough fluid to keep your pee pale yellow. This information is not intended to replace advice given to you by your health care provider. Make sure you discuss any questions you have with   your health care provider. Document Revised: 04/21/2018 Document Reviewed: 04/21/2018 Elsevier Patient Education  2020 Elsevier Inc.  

## 2020-06-10 NOTE — Progress Notes (Signed)
Patient ID: Victoria Cortez  [facility] [place] at [facility] was admitted to the Hospital on 06/08/2020 and Discharged  06/10/2020 and should be excused from work/school   for 4  days starting 06/08/2020 , may return to work/school without any restrictions on 06/14/2020.  Alford Highland M.D on 06/10/2020,at 9:33 AM  [facility] [place] [facility]

## 2020-06-10 NOTE — Discharge Summary (Signed)
[facility] [place] at [facility]   PATIENT NAME: Victoria Cortez    MR#:  [id number]  DATE OF BIRTH:  1986-04-05  DATE OF ADMISSION:  06/08/2020 ADMITTING PHYSICIAN: Andris Baumann, MD  DATE OF DISCHARGE: 06/10/2020 12:42 PM  PRIMARY CARE PHYSICIAN: Toy Cookey, FNP    ADMISSION DIAGNOSIS:  Pyelonephritis [N12] Acute pyelonephritis [N10] Sepsis without acute organ dysfunction, due to unspecified organism (HCC) [A41.9]  DISCHARGE DIAGNOSIS:  Principal Problem:   Acute pyelonephritis Active Problems:   Type 2 diabetes mellitus with hyperglycemia, without long-term current use of insulin (HCC)   Sickle cell trait (HCC)   Sepsis (HCC)   Type 2 diabetes mellitus without complication (HCC)   Hyponatremia   SECONDARY DIAGNOSIS:   Past Medical History:  Diagnosis Date   Diabetes mellitus without complication (HCC)    Sickle cell trait (HCC)     HOSPITAL COURSE:   1.  Clinical sepsis, present on admission with acute pyelonephritis, fever, tachycardia and leukocytosis.  The patient was started initially on meropenem and switched over to Zosyn.  Unfortunately there was not a urine culture that was sent immediately from the emergency room.  I had to add on the urine culture on 06/09/2020 and culture was still pending this a.m.  This afternoon, urine culture looks like multiple species so likely contamination from the urine sitting too long without being cultured right away.  Patient was feeling better and wanted to go home.  Blood culture from the emergency room 2 days ago negative.  I will switch over to Bactrim DS for total of 6 10-day course. 2.  Type 2 diabetes mellitus with elevated hemoglobin A1c being at 11.4.  Case discussed with diabetes coordinator yesterday.  Patient interested in insulin.  I did prescribe Lantus pen insulin and increase to 12 units at bedtime.  I also prescribed Glucophage 500 mg twice a day.  Prescribed insulin needles.   Glucometer, lancets and test strips prescribed.  Low carbohydrate diet. 3.  Sickle cell trait 4.  Hyponatremia improved with IV fluids.  Sodium still low at 132. 5.  Tobacco abuse with slight expiratory wheeze.  Patient did not want an inhaler  DISCHARGE CONDITIONS:   Satisfactory  CONSULTS OBTAINED:  None  DRUG ALLERGIES:   Allergies  Allergen Reactions   Rocephin [Ceftriaxone]     DISCHARGE MEDICATIONS:   Allergies as of 06/10/2020      Reactions   Rocephin [ceftriaxone]       Medication List    TAKE these medications   blood glucose meter kit and supplies Kit Dispense based on patient and insurance preference. Use up to four times daily as directed. (FOR ICD-9 250.00, 250.01).   insulin glargine 100 UNIT/ML Solostar Pen Commonly known as: LANTUS Inject 12 Units into the skin at bedtime.   Insulin Pen Needle 32G X 4 MM Misc 1 Dose by Does not apply route at bedtime.   metFORMIN 500 MG tablet Commonly known as: Glucophage Take 1 tablet (500 mg total) by mouth 2 (two) times daily with a meal.   sulfamethoxazole-trimethoprim 800-160 MG tablet Commonly known as: BACTRIM DS Take 1 tablet by mouth 2 (two) times daily for 8 days.        DISCHARGE INSTRUCTIONS:   Follow-up PCP 5 days  If you experience worsening of your admission symptoms, develop shortness of breath, life threatening emergency, suicidal or homicidal thoughts you must seek medical attention immediately by calling 911 or calling your MD immediately  if  symptoms less severe.  You Must read complete instructions/literature along with all the possible adverse reactions/side effects for all the Medicines you take and that have been prescribed to you. Take any new Medicines after you have completely understood and accept all the possible adverse reactions/side effects.   Please note  You were cared for by a hospitalist during your hospital stay. If you have any questions about your discharge  medications or the care you received while you were in the hospital after you are discharged, you can call the unit and asked to speak with the hospitalist on call if the hospitalist that took care of you is not available. Once you are discharged, your primary care physician will handle any further medical issues. Please note that NO REFILLS for any discharge medications will be authorized once you are discharged, as it is imperative that you return to your primary care physician (or establish a relationship with a primary care physician if you do not have one) for your aftercare needs so that they can reassess your need for medications and monitor your lab values.    Today   CHIEF COMPLAINT:   Chief Complaint  Patient presents with   Generalized Body Aches    HISTORY OF PRESENT ILLNESS:  Victoria Cortez  is a 34 y.o. female came in with body aches and found to have pyelonephritis   VITAL SIGNS:  Blood pressure 120/74, pulse 91, temperature 99.4 F (37.4 C), temperature source Oral, resp. rate 18, height 5' (1.524 m), weight 74.8 kg, last menstrual period 05/16/2020, SpO2 100 %, unknown if currently breastfeeding.  I/O:    Intake/Output Summary (Last 24 hours) at 06/10/2020 1338 Last data filed at 06/10/2020 0900 Gross per 24 hour  Intake 2425.99 ml  Output --  Net 2425.99 ml    PHYSICAL EXAMINATION:  GENERAL:  34 y.o.-year-old patient lying in the bed with no acute distress.  EYES: Pupils equal, round, reactive to light and accommodation. No scleral icterus.  HEENT: Head atraumatic, normocephalic. Oropharynx and nasopharynx clear.   LUNGS: Normal breath sounds bilaterally, slight expiratory wheezing. No use of accessory muscles of respiration.  CARDIOVASCULAR: S1, S2 normal. No murmurs, rubs, or gallops.  ABDOMEN: Soft, non-tender, non-distended. EXTREMITIES: No pedal edema.  NEUROLOGIC: Cranial nerves II through XII are intact. Muscle strength 5/5 in all extremities.   PSYCHIATRIC: The patient is alert and oriented x 3.  SKIN: No obvious rash, lesion, or ulcer.   DATA REVIEW:   CBC Recent Labs  Lab 06/10/20 0517  WBC 7.9  HGB 11.0*  HCT 30.3*  PLT 170    Chemistries  Recent Labs  Lab 06/08/20 1311 06/08/20 2056 06/10/20 0517  NA 128*   < > 132*  K 4.0   < > 3.6  CL 99   < > 103  CO2 16*   < > 19*  GLUCOSE 308*   < > 212*  BUN 7   < > 9  CREATININE 0.94   < > 0.84  CALCIUM 8.4*   < > 8.1*  AST 13*  --   --   ALT 11  --   --   ALKPHOS 86  --   --   BILITOT 1.4*  --   --    < > = values in this interval not displayed.    Microbiology Results  Results for orders placed or performed during the hospital encounter of 06/08/20  Urine Culture     Status: Abnormal   Collection  Time: 06/08/20  1:11 PM   Specimen: Urine, Random  Result Value Ref Range Status   Specimen Description   Final    URINE, RANDOM Performed at [facility], [address] [place], [place] [address]    Special Requests   Final    Normal Performed at [facility], [address] [place], [place] [address]    Culture MULTIPLE SPECIES PRESENT, SUGGEST RECOLLECTION (A)  Final   Report Status 06/10/2020 FINAL  Final  Resp Panel by RT-PCR (Flu A&B, Covid) Nasopharyngeal Swab     Status: None   Collection Time: 06/08/20  6:12 PM   Specimen: Nasopharyngeal Swab; Nasopharyngeal(NP) swabs in vial transport medium  Result Value Ref Range Status   SARS Coronavirus 2 by RT PCR NEGATIVE NEGATIVE Final    Comment: (NOTE) SARS-CoV-2 target nucleic acids are NOT DETECTED.  The SARS-CoV-2 RNA is generally detectable in upper respiratory specimens during the acute phase of infection. The lowest concentration of SARS-CoV-2 viral copies this assay can detect is 138 copies/mL. A negative result does not preclude SARS-Cov-2 infection and should not be used as the sole basis for treatment or other patient management decisions. A negative result may occur  with  improper specimen collection/handling, submission of specimen other than nasopharyngeal swab, presence of viral mutation(s) within the areas targeted by this assay, and inadequate number of viral copies(<138 copies/mL). A negative result must be combined with clinical observations, patient history, and epidemiological information. The expected result is Negative.  Fact Sheet for Patients:  EntrepreneurPulse.com.au  Fact Sheet for Healthcare Providers:  IncredibleEmployment.be  This test is no t yet approved or cleared by the [place] FDA and  has been authorized for detection and/or diagnosis of SARS-CoV-2 by FDA under an Emergency Use Authorization (EUA). This EUA will remain  in effect (meaning this test can be used) for the duration of the COVID-19 declaration under Section 564(b)(1) of the Act, 21 U.S.C.section 360bbb-3(b)(1), unless the authorization is terminated  or revoked sooner.       Influenza A by PCR NEGATIVE NEGATIVE Final   Influenza B by PCR NEGATIVE NEGATIVE Final    Comment: (NOTE) The Xpert Xpress SARS-CoV-2/FLU/RSV plus assay is intended as an aid in the diagnosis of influenza from Nasopharyngeal swab specimens and should not be used as a sole basis for treatment. Nasal washings and aspirates are unacceptable for Xpert Xpress SARS-CoV-2/FLU/RSV testing.  Fact Sheet for Patients: EntrepreneurPulse.com.au  Fact Sheet for Healthcare Providers: IncredibleEmployment.be  This test is not yet approved or cleared by the [place] FDA and has been authorized for detection and/or diagnosis of SARS-CoV-2 by FDA under an Emergency Use Authorization (EUA). This EUA will remain in effect (meaning this test can be used) for the duration of the COVID-19 declaration under Section 564(b)(1) of the Act, 21 U.S.C. section 360bbb-3(b)(1), unless the authorization is terminated  or revoked.  Performed at [facility], [address] [place], [place] [address]   Culture, blood (single)     Status: None (Preliminary result)   Collection Time: 06/08/20  6:45 PM   Specimen: BLOOD  Result Value Ref Range Status   Specimen Description BLOOD BLOOD RIGHT HAND  Final   Special Requests   Final    BOTTLES DRAWN AEROBIC AND ANAEROBIC Blood Culture adequate volume   Culture   Final    NO GROWTH 2 DAYS Performed at [facility], [address] [place],  [address]    Report Status PENDING  Incomplete    RADIOLOGY:  CT ABDOMEN PELVIS W CONTRAST  Result Date: 06/08/2020 CLINICAL DATA:  Generalized body aches x3 days. EXAM: CT ABDOMEN AND PELVIS WITH CONTRAST TECHNIQUE: Multidetector CT imaging of the abdomen and pelvis was performed using the standard protocol following bolus administration of intravenous contrast. CONTRAST:  162mL OMNIPAQUE IOHEXOL 300 MG/ML  SOLN COMPARISON:  Nov 03, 2013 FINDINGS: Lower chest: No acute abnormality. Hepatobiliary: No focal liver abnormality is seen. No gallstones, gallbladder wall thickening, or biliary dilatation. Pancreas: Unremarkable. No pancreatic ductal dilatation or surrounding inflammatory changes. Spleen: Normal in size without focal abnormality. Adrenals/Urinary Tract: Adrenal glands are unremarkable. The right kidney is normal in appearance, without renal calculi, focal lesion, or hydronephrosis. A 2.2 cm x 1.3 cm cyst is seen within the anterior aspect of the mid right kidney. A striated nephrogram is seen involving the mid and lower portions of the right kidney. A mild amount of right-sided perinephric inflammatory fat stranding is also seen. Bladder is unremarkable. Stomach/Bowel: Stomach is within normal limits. The appendix is not clearly identified. No evidence of bowel wall thickening, distention, or inflammatory changes. Noninflamed diverticula are seen within the cecum and ascending colon.  Vascular/Lymphatic: No significant vascular findings are present. No enlarged abdominal or pelvic lymph nodes. Reproductive: The uterus is normal in size and appearance. A 2.0 cm diameter cyst is seen within the left adnexa. Other: No abdominal wall hernia or abnormality. No abdominopelvic ascites. Musculoskeletal: No acute or significant osseous findings. IMPRESSION: 1. Findings consistent with acute pyelonephritis involving the mid and lower portions of the right kidney. 2. Colonic diverticulosis. 3. Small left adnexal cyst, likely ovarian in origin. Electronically Signed   By: Virgina Norfolk M.D.   On: 06/08/2020 19:14    Management plans discussed with the patient, and she is in agreement.  CODE STATUS:     Code Status Orders  (From admission, onward)         Start     Ordered   06/08/20 1942  Full code  Continuous        06/08/20 1944        Code Status History    Date Active Date Inactive Code Status Order ID Comments User Context   11/29/2015 0944 11/30/2015 1458 Full Code [id number]  Hillary Bow, MD ED   02/03/2015 1413 02/07/2015 1941 Full Code [id number]  Benjaman Kindler, MD Inpatient   01/12/2015 1231 01/17/2015 1303 Full Code [id number]  Lorette Ang, MD Inpatient   01/09/2015 2053 01/10/2015 0219 Full Code [id number]  Janett Billow, RN Inpatient   11/26/2014 1832 11/27/2014 0055 Full Code [id number]  Kieth Brightly, RN Inpatient   Advance Care Planning Activity      TOTAL TIME TAKING CARE OF THIS PATIENT: 34 minutes.    Loletha Grayer M.D on 06/10/2020 at 1:38 PM  Between 7am to 6pm - Pager - [phone]  After 6pm go to [email or web address] - password [email or web address]  [facility]  CC: Primary care physician; Gennette Pac, [place]

## 2020-06-13 LAB — CULTURE, BLOOD (SINGLE)
Culture: NO GROWTH
Special Requests: ADEQUATE

## 2022-04-30 IMAGING — CT CT ABD-PELV W/ CM
2 of 4 series · 16 of 46 positions shown, 18 images · IV contrast (APPLIED)
Comparison: November 03, 2013

CLINICAL DATA: Generalized body aches x3 days.

EXAM:
CT ABDOMEN AND PELVIS WITH CONTRAST
TECHNIQUE: Multidetector CT imaging of the abdomen and pelvis was performed
using the standard protocol following bolus administration of
intravenous contrast.
CONTRAST:  100mL OMNIPAQUE IOHEXOL 300 MG/ML  SOLN

[Series 2: routine abd/pel with · axial · 0.74mm/px · z∈[-466,-36]mm · 13 of 94 slices shown, 15 images]
[im 4/94  soft-tissue]
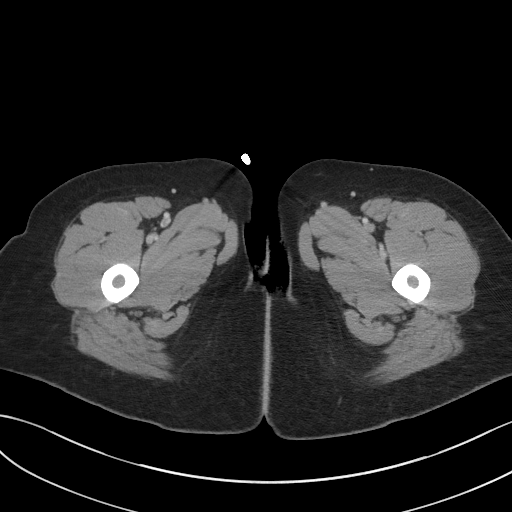
[im 4/94  bone]
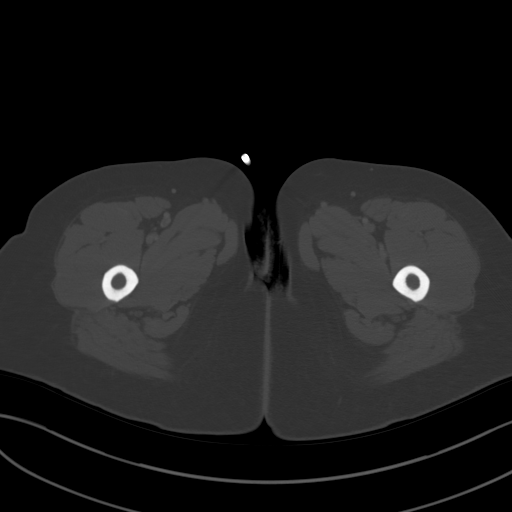
[im 12/94  soft-tissue]
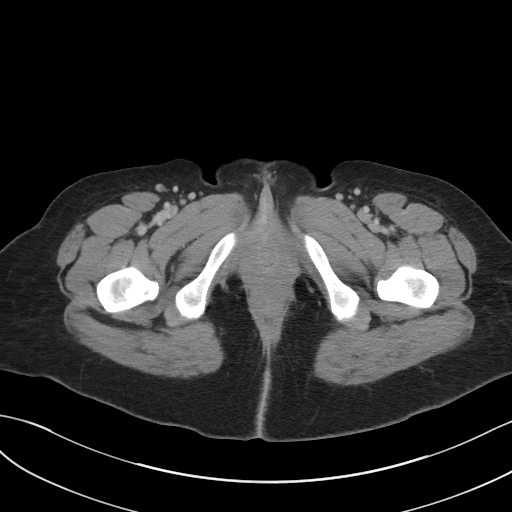
[im 20/94  soft-tissue]
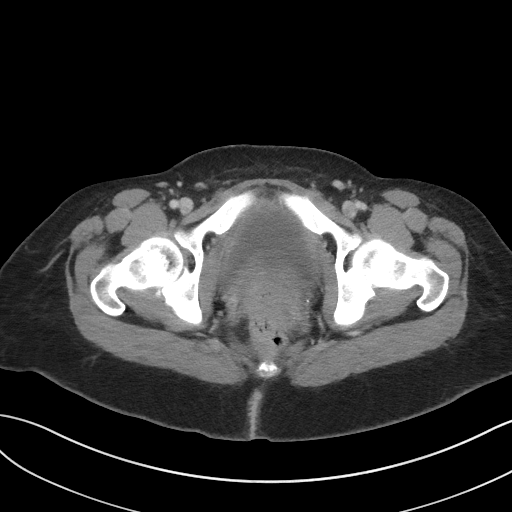
[im 28/94  soft-tissue]
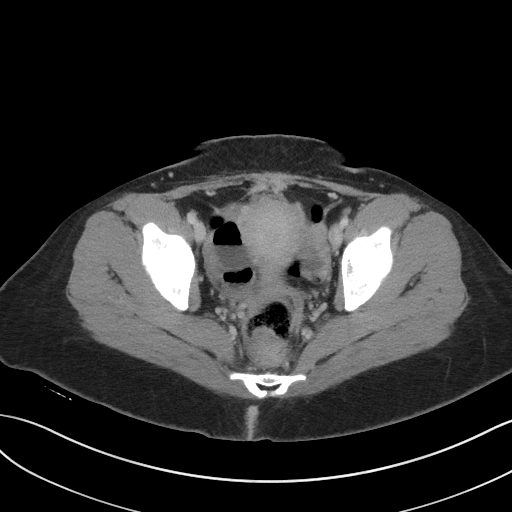
[im 32/94  soft-tissue]
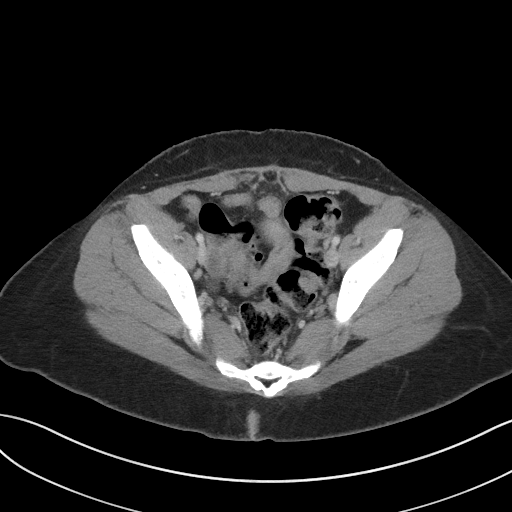
[im 39/94  soft-tissue]
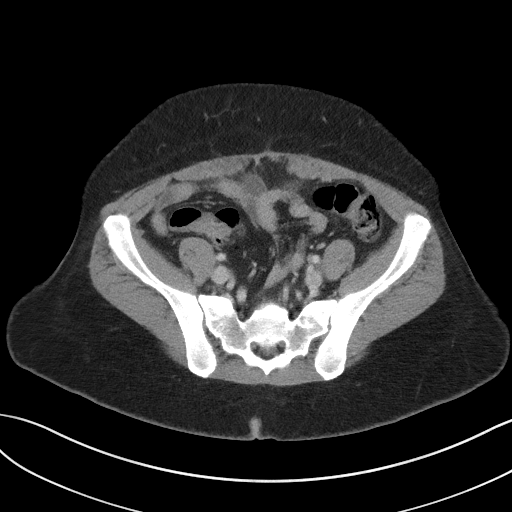
[im 47/94  soft-tissue]
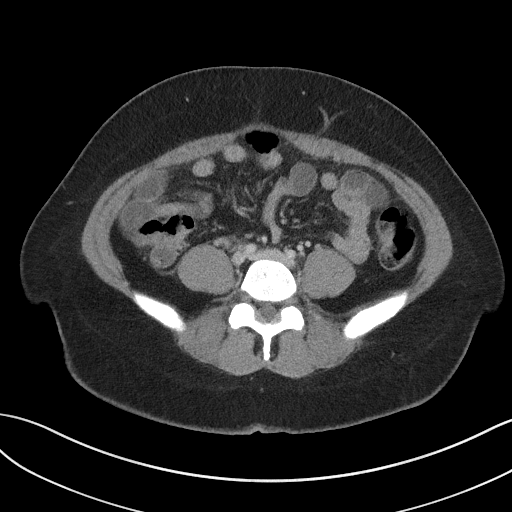
[im 55/94  soft-tissue]
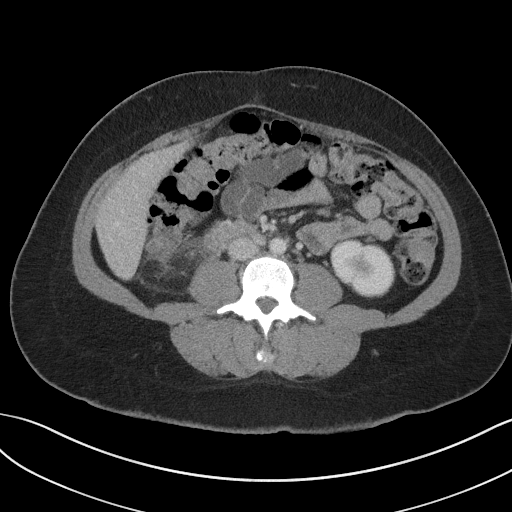
[im 63/94  soft-tissue]
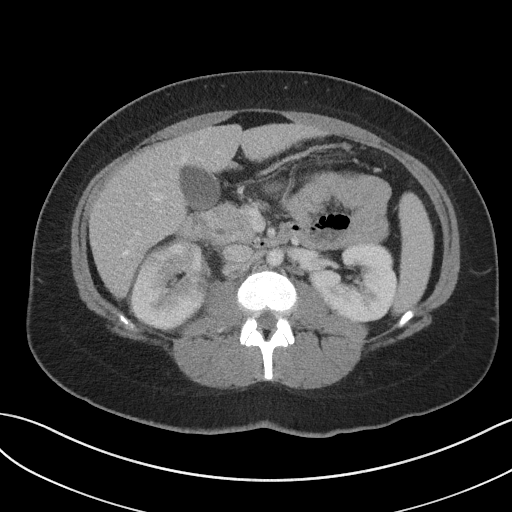
[im 63/94  bone]
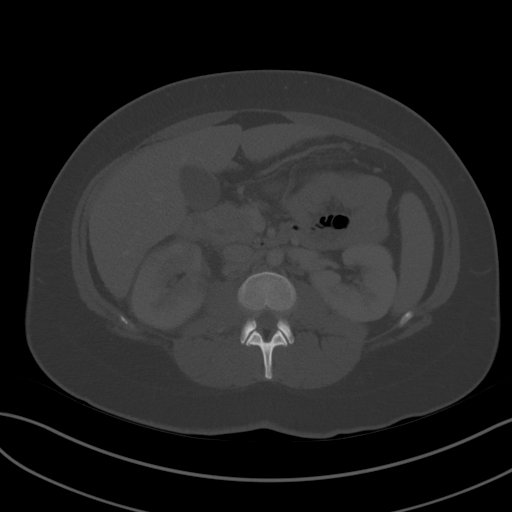
[im 66/94  soft-tissue]
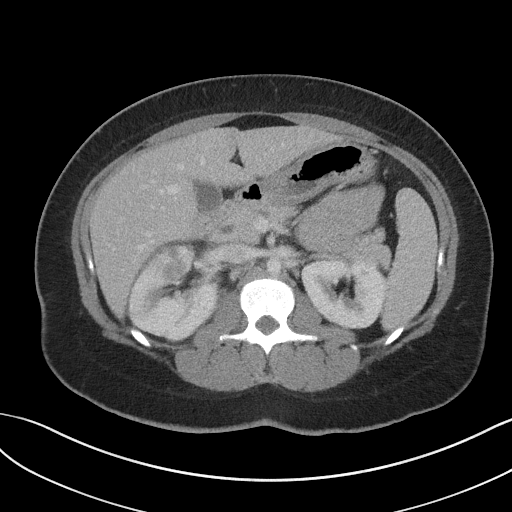
[im 74/94  soft-tissue]
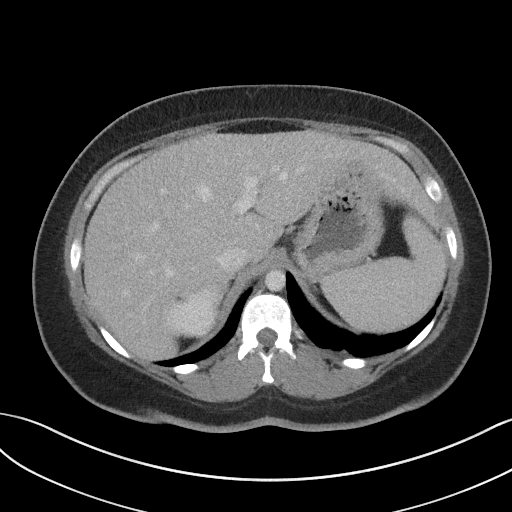
[im 82/94  soft-tissue]
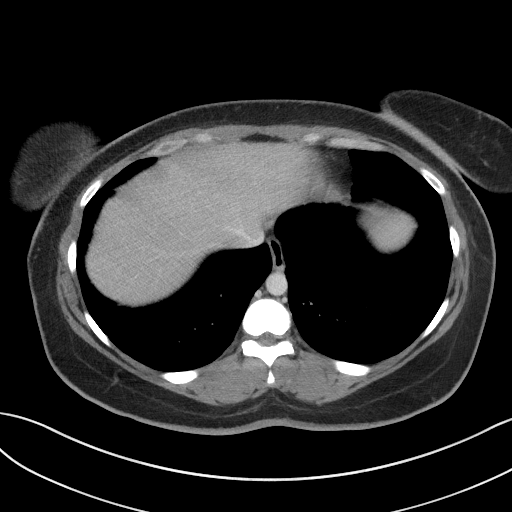
[im 90/94  soft-tissue]
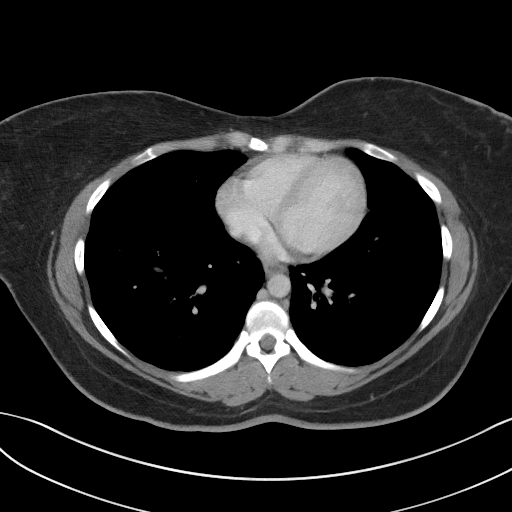

[Series 5: coronal st · coronal · 0.79mm/px · 3 of 94 slices shown]
[im 32/94  soft-tissue]
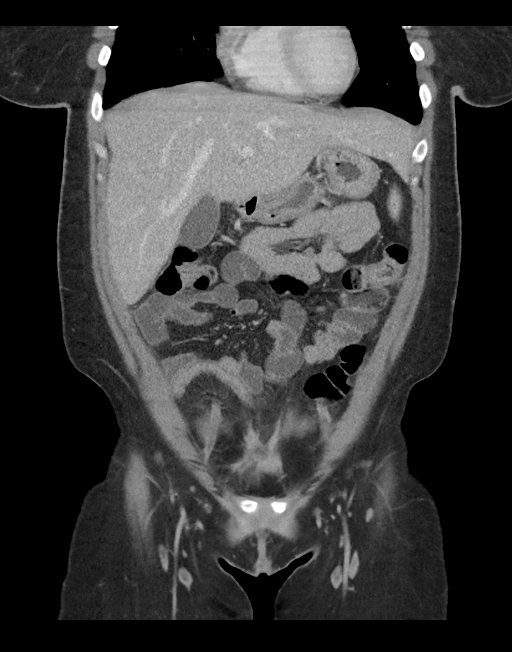
[im 42/94  soft-tissue]
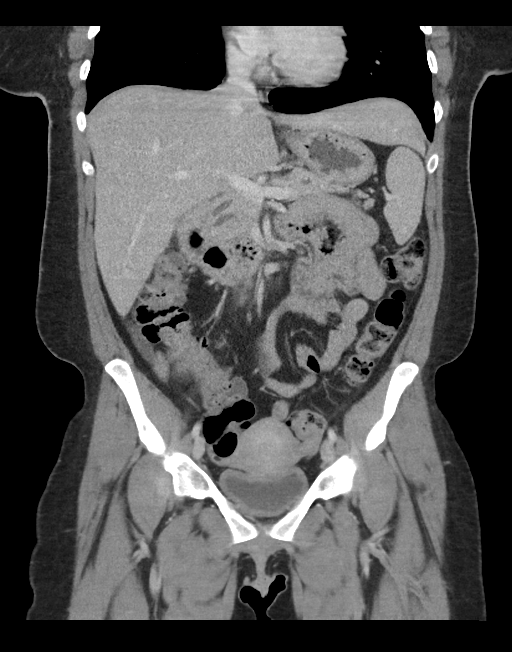
[im 52/94  soft-tissue]
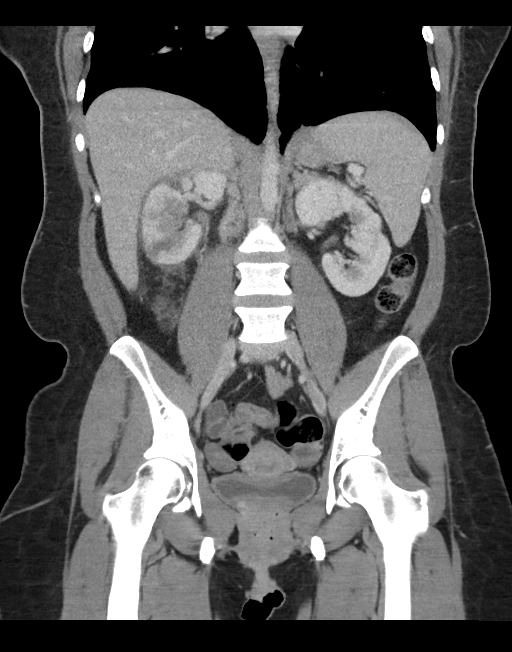

[16 of 46 positions shown; findings below may reference images not displayed]

FINDINGS: Lower chest: No acute abnormality.

Hepatobiliary: No focal liver abnormality is seen. No gallstones,
gallbladder wall thickening, or biliary dilatation.

Pancreas: Unremarkable. No pancreatic ductal dilatation or
surrounding inflammatory changes.

Spleen: Normal in size without focal abnormality.

Adrenals/Urinary Tract: Adrenal glands are unremarkable. The right
kidney is normal in appearance, without renal calculi, focal lesion,
or hydronephrosis. A 2.2 cm x 1.3 cm cyst is seen within the
anterior aspect of the mid right kidney. A striated nephrogram is
seen involving the mid and lower portions of the right kidney. A
mild amount of right-sided perinephric inflammatory fat stranding is
also seen. Bladder is unremarkable.

Stomach/Bowel: Stomach is within normal limits. The appendix is not
clearly identified. No evidence of bowel wall thickening,
distention, or inflammatory changes. Noninflamed diverticula are
seen within the cecum and ascending colon.

Vascular/Lymphatic: No significant vascular findings are present. No
enlarged abdominal or pelvic lymph nodes.

Reproductive: The uterus is normal in size and appearance. A 2.0 cm
diameter cyst is seen within the left adnexa.

Other: No abdominal wall hernia or abnormality. No abdominopelvic
ascites.

Musculoskeletal: No acute or significant osseous findings.
IMPRESSION: 1. Findings consistent with acute pyelonephritis involving the mid
and lower portions of the right kidney.
2. Colonic diverticulosis.
3. Small left adnexal cyst, likely ovarian in origin.
